# Patient Record
Sex: Male | Born: 1965
Health system: Southern US, Community
[De-identification: ages and names within clinical notes are randomized; demographics above are authoritative.]

## PROBLEM LIST (undated history)

## (undated) DIAGNOSIS — T7840XA Allergy, unspecified, initial encounter: Secondary | ICD-10-CM

## (undated) DIAGNOSIS — Q231 Congenital insufficiency of aortic valve: Secondary | ICD-10-CM

## (undated) DIAGNOSIS — Q2381 Bicuspid aortic valve: Secondary | ICD-10-CM

## (undated) DIAGNOSIS — I712 Thoracic aortic aneurysm, without rupture, unspecified: Secondary | ICD-10-CM

## (undated) DIAGNOSIS — I350 Nonrheumatic aortic (valve) stenosis: Secondary | ICD-10-CM

## (undated) HISTORY — DX: Allergy, unspecified, initial encounter: T78.40XA

## (undated) HISTORY — DX: Thoracic aortic aneurysm, without rupture: I71.2

## (undated) HISTORY — DX: Nonrheumatic aortic (valve) stenosis: I35.0

## (undated) HISTORY — PX: CERVICAL DISC SURGERY: SHX588

## (undated) HISTORY — DX: Bicuspid aortic valve: Q23.81

## (undated) HISTORY — DX: Thoracic aortic aneurysm, without rupture, unspecified: I71.20

## (undated) HISTORY — DX: Congenital insufficiency of aortic valve: Q23.1

## (undated) HISTORY — PX: SIGMOIDOSCOPY: SUR1295

---

## 2006-01-03 ENCOUNTER — Emergency Department (HOSPITAL_COMMUNITY): Admission: EM | Admit: 2006-01-03 | Discharge: 2006-01-03 | Payer: Self-pay | Admitting: Family Medicine

## 2009-03-06 ENCOUNTER — Ambulatory Visit (HOSPITAL_COMMUNITY): Admission: RE | Admit: 2009-03-06 | Discharge: 2009-03-07 | Payer: Self-pay | Admitting: Neurological Surgery

## 2010-11-23 ENCOUNTER — Ambulatory Visit (HOSPITAL_COMMUNITY)
Admission: RE | Admit: 2010-11-23 | Discharge: 2010-11-24 | Payer: Self-pay | Source: Home / Self Care | Admitting: Neurological Surgery

## 2010-12-28 ENCOUNTER — Ambulatory Visit (HOSPITAL_COMMUNITY): Admission: RE | Admit: 2010-12-28 | Payer: Self-pay | Source: Home / Self Care | Admitting: Neurological Surgery

## 2011-03-02 LAB — CBC
HCT: 40.7 % (ref 39.0–52.0)
Hemoglobin: 14.5 g/dL (ref 13.0–17.0)
RDW: 12.5 % (ref 11.5–15.5)

## 2011-04-01 LAB — BASIC METABOLIC PANEL
CO2: 24 mEq/L (ref 19–32)
Calcium: 9.4 mg/dL (ref 8.4–10.5)
Chloride: 104 mEq/L (ref 96–112)
GFR calc Af Amer: >60 mL/min (ref >60)
GFR calc non Af Amer: >60 mL/min (ref >60)
Sodium: 137 mEq/L (ref 135–145)

## 2011-04-01 LAB — CBC
MCV: 84.7 fL (ref 78.0–100.0)
RBC: 5.01 MIL/uL (ref 4.22–5.81)

## 2011-05-04 NOTE — Op Note (Signed)
NAME:  Adrian Murphy, Adrian Murphy NO.:  [id number]   MEDICAL RECORD NO.:  [id number]          PATIENT TYPE:  OIB   LOCATION:  3534                         FACILITY:  [facility]   PHYSICIAN:  Stefani Dama, M.D.  DATE OF BIRTH:  Feb 21, 1966   DATE OF PROCEDURE:  DATE OF DISCHARGE:                               OPERATIVE REPORT   PREOPERATIVE DIAGNOSIS:  Cervical herniated nucleus pulposus C5-6 with  myelopathy.   POSTOPERATIVE DIAGNOSIS:  Cervical herniated nucleus pulposus C5-6 with  myelopathy.   PROCEDURES:  Anterior cervical decompression C5-C6 arthrodesis with  structural allograft, Alphatec fixation C5-C6.   SURGEON:  Stefani Dama, MD   FIRST ASSISTANT:  Clydene Fake, MD   ANESTHESIA:  General endotracheal.   INDICATIONS:  Adrian Murphy is a 45 year old individual who has suffered  a large herniated nucleus pulposus C5-C6 with cord compression.  He had  initial symptoms of severe myelopathy but this resolved significantly,  and he has occasional difficulties with pain in his neck and symptoms  consistent with Lhermitte phenomenon.   PROCEDURE:  The patient was brought to the operating room, placed on the  table in a supine position.  After the smooth induction of general  endotracheal anesthesia, he was placed in 5 pounds of halter traction.  Neck was prepped with alcohol and DuraPrep and draped in a sterile  fashion.  A transverse incision was created in the left side of the  neck, and this was carried down through the platysma.  Plane between the  sternocleidomastoid and the strap muscles were dissected bluntly until  the prevertebral space was reached.  The most superior identified disk  space in this opening was localized at C4-5 on a radiograph.  Dissection  was easily carried inferiorly slightly to expose C5-C6.  The longus  colli muscle was stripped off either side of the midline, and the Caspar  retractor was then placed under the longus colli muscle.   The disk space  was opened with a #15 blade and a significant quantity of moderately  degenerated disk material was removed.  As the region of the posterior  longitudinal ligament was reached, there was noted to be a  subligamentously herniated disk material pressing dorsally into the cord  itself.  This was removed and then the ligament itself was taken up with  a 2-mm Kerrison punch which allowed for good decompression of the  central portion of the cord.  Dissection was carried out into the far  lateral recesses to decompress the C6 nerve roots on either side.  Hemostasis in the soft tissue was obtained meticulously, and the  endplates were shaved smooth with a high-speed bur and a 5-mm barrel  bit.  Once they were sized appropriately, an 8-mm transgraft was shaved  to the appropriate size and configuration to fit within this interval.  The graft itself was then filled with demineralized bone matrix and then  tamped into place and countersunk slightly.  The ventral aspects of the  vertebrae were then shaved appropriately to allow placement of a 14-mm  Alphatec Trestle plate.  This was  fitted to the ventral aspect of the  vertebral bodies with 4 variable angle locking 4- x 14-mm screws.  The  traction at this point was removed.  The screws were secured tightly and  then a final radiograph was obtained to identify good position of the  surgical construct.  The remainder of the demineralized bone matrix was  packed into the lateral gutters.  Hemostasis was checked in the cervical  wounds and under the longus colli muscle on either side.  Once this was  verified, the wound  was irrigated copiously with antibiotic irrigating solution, and the  platysma was closed with 3-0 Vicryl in interrupted fashion, 3-0 Vicryl  was used in the subcuticular tissues, and Dermabond was placed on the  skin.  The patient tolerated the procedure well, was returned to  recovery room in stable  condition.      Stefani Dama, M.D.  Electronically Signed     HJE/MEDQ  D:  03/06/2009  T:  03/07/2009  Job:  [id number]

## 2011-10-27 DIAGNOSIS — H5340 Unspecified visual field defects: Secondary | ICD-10-CM | POA: Insufficient documentation

## 2011-10-27 DIAGNOSIS — H47299 Other optic atrophy, unspecified eye: Secondary | ICD-10-CM | POA: Insufficient documentation

## 2015-08-01 ENCOUNTER — Other Ambulatory Visit: Payer: Self-pay | Admitting: Internal Medicine

## 2015-08-01 DIAGNOSIS — M5412 Radiculopathy, cervical region: Secondary | ICD-10-CM

## 2015-08-05 ENCOUNTER — Ambulatory Visit
Admission: RE | Admit: 2015-08-05 | Discharge: 2015-08-05 | Disposition: A | Discharge status: Home / Self Care | Payer: BLUE CROSS/BLUE SHIELD | Source: Ambulatory Visit | Attending: Internal Medicine | Admitting: Internal Medicine

## 2015-08-05 DIAGNOSIS — M5412 Radiculopathy, cervical region: Secondary | ICD-10-CM

## 2015-08-06 ENCOUNTER — Other Ambulatory Visit: Payer: Self-pay | Admitting: Internal Medicine

## 2015-08-06 DIAGNOSIS — M5412 Radiculopathy, cervical region: Secondary | ICD-10-CM

## 2016-05-20 DIAGNOSIS — R8290 Unspecified abnormal findings in urine: Secondary | ICD-10-CM | POA: Diagnosis not present

## 2016-05-20 DIAGNOSIS — Z0000 Encounter for general adult medical examination without abnormal findings: Secondary | ICD-10-CM | POA: Diagnosis not present

## 2016-05-20 DIAGNOSIS — Z125 Encounter for screening for malignant neoplasm of prostate: Secondary | ICD-10-CM | POA: Diagnosis not present

## 2016-05-26 DIAGNOSIS — Z1389 Encounter for screening for other disorder: Secondary | ICD-10-CM | POA: Diagnosis not present

## 2016-05-26 DIAGNOSIS — Z125 Encounter for screening for malignant neoplasm of prostate: Secondary | ICD-10-CM | POA: Diagnosis not present

## 2016-05-26 DIAGNOSIS — Z8249 Family history of ischemic heart disease and other diseases of the circulatory system: Secondary | ICD-10-CM | POA: Diagnosis not present

## 2016-05-26 DIAGNOSIS — Z0000 Encounter for general adult medical examination without abnormal findings: Secondary | ICD-10-CM | POA: Diagnosis not present

## 2016-05-26 DIAGNOSIS — N529 Male erectile dysfunction, unspecified: Secondary | ICD-10-CM | POA: Diagnosis not present

## 2016-05-26 DIAGNOSIS — M5412 Radiculopathy, cervical region: Secondary | ICD-10-CM | POA: Diagnosis not present

## 2016-05-26 DIAGNOSIS — L989 Disorder of the skin and subcutaneous tissue, unspecified: Secondary | ICD-10-CM | POA: Diagnosis not present

## 2016-05-27 DIAGNOSIS — Z1212 Encounter for screening for malignant neoplasm of rectum: Secondary | ICD-10-CM | POA: Diagnosis not present

## 2016-06-15 DIAGNOSIS — C44319 Basal cell carcinoma of skin of other parts of face: Secondary | ICD-10-CM | POA: Diagnosis not present

## 2016-08-10 DIAGNOSIS — Z85828 Personal history of other malignant neoplasm of skin: Secondary | ICD-10-CM | POA: Diagnosis not present

## 2016-11-09 DIAGNOSIS — Z85828 Personal history of other malignant neoplasm of skin: Secondary | ICD-10-CM | POA: Diagnosis not present

## 2016-11-09 DIAGNOSIS — L570 Actinic keratosis: Secondary | ICD-10-CM | POA: Diagnosis not present

## 2016-11-09 DIAGNOSIS — L821 Other seborrheic keratosis: Secondary | ICD-10-CM | POA: Diagnosis not present

## 2016-11-09 DIAGNOSIS — D1801 Hemangioma of skin and subcutaneous tissue: Secondary | ICD-10-CM | POA: Diagnosis not present

## 2016-12-17 DIAGNOSIS — Z6829 Body mass index (BMI) 29.0-29.9, adult: Secondary | ICD-10-CM | POA: Diagnosis not present

## 2016-12-17 DIAGNOSIS — M7712 Lateral epicondylitis, left elbow: Secondary | ICD-10-CM | POA: Diagnosis not present

## 2017-03-04 DIAGNOSIS — L308 Other specified dermatitis: Secondary | ICD-10-CM | POA: Diagnosis not present

## 2017-05-13 DIAGNOSIS — L570 Actinic keratosis: Secondary | ICD-10-CM | POA: Diagnosis not present

## 2017-05-13 DIAGNOSIS — Z85828 Personal history of other malignant neoplasm of skin: Secondary | ICD-10-CM | POA: Diagnosis not present

## 2017-05-13 DIAGNOSIS — L309 Dermatitis, unspecified: Secondary | ICD-10-CM | POA: Diagnosis not present

## 2017-06-01 DIAGNOSIS — Z0000 Encounter for general adult medical examination without abnormal findings: Secondary | ICD-10-CM | POA: Diagnosis not present

## 2017-06-01 DIAGNOSIS — Z125 Encounter for screening for malignant neoplasm of prostate: Secondary | ICD-10-CM | POA: Diagnosis not present

## 2017-06-08 DIAGNOSIS — Z0000 Encounter for general adult medical examination without abnormal findings: Secondary | ICD-10-CM | POA: Diagnosis not present

## 2017-06-08 DIAGNOSIS — Z85828 Personal history of other malignant neoplasm of skin: Secondary | ICD-10-CM | POA: Diagnosis not present

## 2017-06-08 DIAGNOSIS — Z8249 Family history of ischemic heart disease and other diseases of the circulatory system: Secondary | ICD-10-CM | POA: Diagnosis not present

## 2017-06-08 DIAGNOSIS — N528 Other male erectile dysfunction: Secondary | ICD-10-CM | POA: Diagnosis not present

## 2017-06-08 DIAGNOSIS — Z1389 Encounter for screening for other disorder: Secondary | ICD-10-CM | POA: Diagnosis not present

## 2017-06-08 DIAGNOSIS — R7301 Impaired fasting glucose: Secondary | ICD-10-CM | POA: Diagnosis not present

## 2017-06-08 DIAGNOSIS — Z125 Encounter for screening for malignant neoplasm of prostate: Secondary | ICD-10-CM | POA: Diagnosis not present

## 2017-06-09 ENCOUNTER — Other Ambulatory Visit: Payer: Self-pay | Admitting: Internal Medicine

## 2017-06-09 DIAGNOSIS — Z8249 Family history of ischemic heart disease and other diseases of the circulatory system: Secondary | ICD-10-CM

## 2017-06-13 ENCOUNTER — Ambulatory Visit
Admission: RE | Admit: 2017-06-13 | Discharge: 2017-06-13 | Disposition: A | Discharge status: Home / Self Care | Payer: No Typology Code available for payment source | Source: Ambulatory Visit | Attending: Internal Medicine | Admitting: Internal Medicine

## 2017-06-13 DIAGNOSIS — Z8249 Family history of ischemic heart disease and other diseases of the circulatory system: Secondary | ICD-10-CM

## 2017-07-06 ENCOUNTER — Telehealth: Payer: Self-pay | Admitting: Cardiology

## 2017-07-06 NOTE — Telephone Encounter (Signed)
[facility] 07[facility]21[facility]17 with Dr Jens Somrenshaw.  Records put with Dr Ludwig Clarksrenshaw's schedule for 07/21/17. lp

## 2017-07-19 NOTE — Progress Notes (Signed)
    Su Hilteferring-William Shaw, MD Reason for referral-Thoracic aortic aneurysm  HPI: 51 yo male for evaluation of TAA at request of Martha ClanWilliam Shaw, MD. Laboratories from June 2018 personally reviewed and showed total cholesterol 161 and LDL 93. Creatinine 0.9 and potassium 4.7. Normal liver functions. Patient had a cardiac CT in June 2018. Coronary calcium score was 0. He was noted to have a thoracic aortic aneurysm measuring 4.5 cm. There was note of small pulmonary nodules and follow-up noncontrast chest CT could be considered in 12 months if patient high risk. Patient denies dyspnea on exertion, orthopnea, PND, pedal edema, exertional chest pain or syncope. Because of the above we were asked to evaluate.   Current Outpatient Prescriptions  Medication Sig Dispense Refill  . Cetirizine HCl (ZYRTEC ALLERGY) 10 MG CAPS Take 1 tablet by mouth daily.    [name]. CIALIS 5 MG tablet Take 5 mg by mouth daily.  2   No current facility-administered medications for this visit.     No Known Allergies   Past Medical History:  Diagnosis Date  . Thoracic aortic aneurysm [facility])     Past Surgical History:  Procedure Laterality Date  . CERVICAL DISC SURGERY      Social History   Social History  . Marital status: Married    Spouse name: N/A  . Number of children: 4  . Years of education: N/A   Occupational History  . Not on file.   Social History Main Topics  . Smoking status: Never Smoker  . Smokeless tobacco: Never Used  . Alcohol use No  . Drug use: Unknown  . Sexual activity: Not on file   Other Topics Concern  . Not on file   Social History Narrative  . No narrative on file    Family History  Problem Relation Age of Onset  . Alzheimer's disease Mother   . CAD Father     ROS: no fevers or chills, productive cough, hemoptysis, dysphasia, odynophagia, melena, hematochezia, dysuria, hematuria, rash, seizure activity, orthopnea, PND, pedal edema, claudication. Remaining systems are  negative.  Physical Exam:   Blood pressure 90/60, pulse 68, height 5\' 9"  (1.753 m), weight 94.6 kg (208 lb 9.6 oz).  General:  Well developed/well nourished in NAD Skin warm/dry Patient not depressed No peripheral clubbing Back-normal HEENT-normal/normal eyelids Neck supple/normal carotid upstroke bilaterally; no bruits; no JVD; no thyromegaly chest - CTA/ normal expansion CV - RRR/normal S1 and S2; no murmurs, rubs or gallops;  PMI nondisplaced Abdomen -NT/ND, no HSM, no mass, + bowel sounds, soft bruit 2+ femoral pulses, no bruits Ext-no edema, chords, 2+ DP Neuro-grossly nonfocal  ECG - normal sinus rhythm at a rate of 68. No ST changes. personally reviewed  A/P  1 thoracic aortic aneurysm-plan follow-up CTA December 2018; I will include the abdominal aorta as there is a soft bruit. Plan echocardiogram to make sure patient does not have bicuspid aortic valve. No family history of dissection. Patient does not have Marfan's. Will not add ARB or beta blocker as systolic blood pressure is 90 and no history of hypertension.   2 Family history of coronary artery disease-calcium score is 0 and patient is asymptomatic. Electrocardiogram is normal. Continue lifestyle modification.  Olga MillersBrian Adden Strout, MD

## 2017-07-21 ENCOUNTER — Ambulatory Visit (INDEPENDENT_AMBULATORY_CARE_PROVIDER_SITE_OTHER): Payer: BLUE CROSS/BLUE SHIELD | Admitting: Cardiology

## 2017-07-21 ENCOUNTER — Encounter: Payer: Self-pay | Admitting: Cardiology

## 2017-07-21 VITALS — BP 90/60 | HR 68 | Ht 69.0 in | Wt 208.6 lb

## 2017-07-21 DIAGNOSIS — I712 Thoracic aortic aneurysm, without rupture, unspecified: Secondary | ICD-10-CM

## 2017-07-21 NOTE — Patient Instructions (Signed)
Medication Instructions:   NO CHANGE  Testing/Procedures:  Your physician has requested that you have an echocardiogram. Echocardiography is a painless test that uses sound waves to create images of your heart. It provides your doctor with information about the size and shape of your heart and how well your heart's chambers and valves are working. This procedure takes approximately one hour. There are no restrictions for this procedure.    Follow-Up:  Your physician wants you to follow-up in: ONE YEAR WITH DR CRENSHAW You will receive a reminder letter in the mail two months in advance. If you don't receive a letter, please call our office to schedule the follow-up appointment.      

## 2017-07-26 ENCOUNTER — Ambulatory Visit (HOSPITAL_COMMUNITY): Payer: BLUE CROSS/BLUE SHIELD | Attending: Cardiology

## 2017-07-26 ENCOUNTER — Other Ambulatory Visit: Payer: Self-pay

## 2017-07-26 DIAGNOSIS — I712 Thoracic aortic aneurysm, without rupture, unspecified: Secondary | ICD-10-CM

## 2017-09-21 ENCOUNTER — Encounter: Payer: BLUE CROSS/BLUE SHIELD | Admitting: Cardiothoracic Surgery

## 2017-09-30 ENCOUNTER — Institutional Professional Consult (permissible substitution) (INDEPENDENT_AMBULATORY_CARE_PROVIDER_SITE_OTHER): Payer: BLUE CROSS/BLUE SHIELD | Admitting: Cardiothoracic Surgery

## 2017-09-30 ENCOUNTER — Encounter: Payer: Self-pay | Admitting: Cardiothoracic Surgery

## 2017-09-30 VITALS — BP 104/71 | HR 73 | Resp 16 | Ht 69.0 in | Wt 200.0 lb

## 2017-09-30 DIAGNOSIS — I712 Thoracic aortic aneurysm, without rupture, unspecified: Secondary | ICD-10-CM

## 2017-09-30 NOTE — Progress Notes (Signed)
[address]       [place] [address]             [phone]                    Adrian Murphy  Medical Record #[id number] Date of Birth: April 01, 1966  Referring: Martha Clan, MD Primary Care: Martha Clan, MD Cardiology: Dr Jens Som  Chief Complaint:    Chief Complaint  Patient presents with  . Thoracic Aortic Aneurysm    eval..Marland KitchenCT CARDIAC SCORING 06/03/17, ECHO     History of Present Illness:    Adrian Murphy 51 y.o. male is seen in the office  today for evaluation of dilated ascending aorta.  Patient had a limted cardiac CT in June 2018. Coronary calcium score was 0. He was noted to have a thoracic aortic aneurysm measuring 4.5 cm. Echocardiogram "suggests bicuspid " valve.    Patient does not have clear family history of ascending aortic aneurysm, bicuspid valve or aortic dissection. However his father died at age 51 two hours after arrival to hospital- assumed to be MI but no autopsy. parenteral grandfather  Died in his  79's  Sudden collapse but no definite dx . He has two sisters in there 60's who have been evaluated for dilated aorta .     Current Activity/ Functional Status:  Patient is independent with mobility/ambulation, transfers, ADL's, IADL's.   Zubrod Score: At the time of surgery this patient's most appropriate activity status/level should be described as: [x]     0    Normal activity, no symptoms []     1    Restricted in physical strenuous activity but ambulatory, able to do out light work []     2    Ambulatory and capable of self care, unable to do work activities, up and about               >50 % of waking hours                              []     3    Only limited self care, in bed greater than 50% of waking hours []     4    Completely disabled, no self care, confined to bed or chair []     5    Moribund   Past Medical History:  Diagnosis Date  . Thoracic aortic aneurysm [facility])     Past Surgical History:  Procedure  Laterality Date  . CERVICAL DISC SURGERY      Family History  Problem Relation Age of Onset  . Alzheimer's disease Mother   . CAD Father     See above     Social History   Social History  . Marital status: Married    Spouse name: N/A  . Number of children: 4  . Years of education: N/A   Occupational History  . 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 Smoking status: Never Smoker  . Smokeless tobacco: Never Used  . Alcohol use No  . Drug use: Unknown  . Sexual activity: Not on file     History  Smoking Status  . Never Smoker  Smokeless Tobacco  . Never Used    History  Alcohol Use No     Allergies  Allergen Reactions  . Pollen Extract Other (See Comments)  Runny nose, Itchy Eyes    Current Outpatient Prescriptions  Medication Sig Dispense Refill  . Cetirizine HCl (ZYRTEC ALLERGY) 10 MG CAPS Take 1 tablet by mouth daily.    [name] CIALIS 5 MG tablet Take 5 mg by mouth daily.  2   No current facility-administered medications for this visit.     Pertinent items are noted in HPI.   Review of Systems:     Cardiac Review of Systems: Y or N  Chest Pain [ n   ]  Resting SOB [  n ] Exertional SOB  [ n ]  Orthopnea [  ]   Pedal Edema [ n  ]    Palpitations [n  ] Syncope  [n  ]   Presyncope [   ]  General Review of Systems: [Y] = yes [  ]=no Constitional: recent weight change [  ];  Wt loss over the last 3 months [   ] anorexia [  ]; fatigue [  ]; nausea [  ]; night sweats [  ]; fever [  ]; or chills [  ];          Dental: poor dentition[ n ]; Last Dentist visit:   Eye : blurred vision [  ]; diplopia [   ]; vision changes [  ];  Amaurosis fugax[  ]; Resp: cough [ n ];  wheezing[n  ];  hemoptysis[n  ]; shortness of breath[  ]; paroxysmal nocturnal dyspnea[  ]; dyspnea on exertion[  ]; or orthopnea[  ];  GI:  gallstones[  ], vomiting[  ];  dysphagia[  ]; melena[  ];  hematochezia [  ]; heartburn[  ];   Hx of  Colonoscopy[  ]; GU: kidney  stones [  ]; hematuria[  ];   dysuria [  ];  nocturia[  ];  history of     obstruction [  ]; urinary frequency [  ]             Skin: rash, swelling[  ];, hair loss[  ];  peripheral edema[  ];  or itching[  ]; Musculosketetal: myalgias[  ];  joint swelling[  ];  joint erythema[  ];  joint pain[  ];  back pain[  ];  Heme/Lymph: bruising[  ];  bleeding[  ];  anemia[  ];  Neuro: TIA[ n ];  headaches[  ];  stroke[  n];  vertigo[  ];  seizures[  ];   paresthesias[  ];  difficulty walking[  ];  Psych:depression[  ]; anxiety[  ];  Endocrine: diabetes[ n ];  thyroid dysfunction[  n];  Immunizations: Flu up to date [ y ]; Pneumococcal up to date [ n ];  Other:  Physical Exam: BP 104/71 (BP Location: Left Arm, Patient Position: Sitting, Cuff Size: Large)   Pulse 73   Resp 16   Ht 5\' 9"  (1.753 m)   Wt 200 lb (90.7 kg)   SpO2 99% Comment: ON RA  BMI 29.53 kg/m   PHYSICAL EXAMINATION: General appearance: alert, cooperative and appears stated age Head: Normocephalic, without obvious abnormality, atraumatic Neck: no adenopathy, no carotid bruit, no JVD, supple, symmetrical, trachea midline and thyroid not enlarged, symmetric, no tenderness/mass/nodules Lymph nodes: Cervical, supraclavicular, and axillary nodes normal. Resp: clear to auscultation bilaterally Back: symmetric, no curvature. ROM normal. No CVA tenderness. Cardio: regular rate and rhythm, S1, S2 normal, no murmur, click, rub or gallop GI: soft, non-tender; bowel sounds normal; no masses,  no organomegaly Extremities: extremities normal,  atraumatic, no cyanosis or edema and Homans sign is negative, no sign of DVT Neurologic: Grossly normal  Diagnostic Studies & Laboratory data:   ECHO:07/26/2017: Echocardiography  Patient:    [name], [name] MR #:       [id number] Study Date: 07/26/2017 Gender:     M Age:        51 Height:     175.3 cm Weight:     94.6 kg BSA:        2.17 m^2 Pt. Status: Room:   ATTENDING    Olga Millers  ORDERING     Arlys John Crenshaw  REFERRING    Olga Millers  SONOGRAPHER  Dewitt Hoes, RDCS  PERFORMING   [facility], Outpatient  cc:  ------------------------------------------------------------------- LV EF: 55% -   60%  ------------------------------------------------------------------- Indications:      Thoracic aortic aneurysm (I71.2).  ------------------------------------------------------------------- History:   Risk factors:  Thoracic aortic aneurysm.  ------------------------------------------------------------------- Study Conclusions  - Left ventricle: The cavity size was normal. Systolic function was   normal. The estimated ejection fraction was in the range of 55%   to 60%. Wall motion was normal; there were no regional wall   motion abnormalities. Left ventricular diastolic function   parameters were normal. - Aortic valve: Moderately calcified annulus. Probable bicuspid;   mildly thickened, mildly calcified leaflets. There was trivial   regurgitation. Mean gradient (S): 11 mm Hg. Valve area (VTI):   2.27 cm^2. - Aorta: Ascending aortic diameter: 44 mm (S). - Ascending aorta: The ascending aorta was mildly dilated. - Pulmonary arteries: Systolic pressure could not be accurately   estimated.  Impressions:  - Normal LVF with EF 55-60%, poorly visualized AV but likely   bicuspid with moderate aortic annular calcification and mildly   thickened AV leaflets with no AS. There is trivial AI. Mild to   moderately dilated ascending aorta at 4.4cm.  ------------------------------------------------------------------- Study data:  No prior study was available for comparison.  Study status:  Routine.  Procedure:  The patient reported no pain pre or post test. Transthoracic echocardiography. Image quality was adequate.  Study completion:  There were no complications. Echocardiography.  M-mode, complete 2D, 3D, spectral Doppler, and color Doppler.  Birthdate:   Patient birthdate: 1966/12/18.  Age: Patient is 51 yr old.  Sex:  Gender: male.    BMI: 30.8 kg/m^2. Blood pressure:     90/60  Patient status:  Outpatient.  Study date:  Study date: 07/26/2017. Study time: 03:55 PM.  Location: [facility]  -------------------------------------------------------------------  ------------------------------------------------------------------- Left ventricle:  The cavity size was normal. Systolic function was normal. The estimated ejection fraction was in the range of 55% to 60%. Wall motion was normal; there were no regional wall motion abnormalities. The transmitral flow pattern was normal. The deceleration time of the early transmitral flow velocity was normal. The pulmonary vein flow pattern was normal. The tissue Doppler parameters were normal. Left ventricular diastolic function parameters were normal.  ------------------------------------------------------------------- Aortic valve:  Poorly visualized. Moderately calcified annulus. Probable bicuspid; mildly thickened, mildly calcified leaflets. Mobility was not restricted.  Doppler:  Transvalvular velocity was within the normal range. There was no stenosis. There was trivial regurgitation.    VTI ratio of LVOT to aortic valve: 0.4. Valve area (VTI): 2.27 cm^2. Indexed valve area (VTI): 1.04 cm^2/m^2. Peak velocity ratio of LVOT to aortic valve: 0.37. Valve area (Vmax): 2.13 cm^2. Indexed valve area (Vmax): 0.98 cm^2/m^2. Mean velocity ratio of LVOT to aortic valve: 0.41. Valve area (Vmean):  2.33 cm^2. Indexed valve area (Vmean): 1.07 cm^2/m^2.    Mean gradient (S): 11 mm Hg. Peak gradient (S): 19 mm Hg.  ------------------------------------------------------------------- Aorta:  Aortic root: The aortic root was normal in size. Ascending aorta: The ascending aorta was mildly dilated.  ------------------------------------------------------------------- Mitral valve:   Structurally  normal valve.   Mobility was not restricted.  Doppler:  Transvalvular velocity was within the normal range. There was no evidence for stenosis. There was no regurgitation.    Peak gradient (D): 4 mm Hg.  ------------------------------------------------------------------- Left atrium:  The atrium was normal in size.  ------------------------------------------------------------------- Right ventricle:  The cavity size was normal. Wall thickness was normal. Systolic function was normal.  ------------------------------------------------------------------- Pulmonic valve:    Structurally normal valve.   Cusp separation was normal.  Doppler:  Transvalvular velocity was within the normal range. There was no evidence for stenosis. There was no regurgitation.  ------------------------------------------------------------------- Tricuspid valve:   Structurally normal valve.    Doppler: Transvalvular velocity was within the normal range. There was no regurgitation.  ------------------------------------------------------------------- Pulmonary artery:   The main pulmonary artery was normal-sized. Systolic pressure could not be accurately estimated.  ------------------------------------------------------------------- Right atrium:  The atrium was normal in size.  ------------------------------------------------------------------- Pericardium:  There was no pericardial effusion.  ------------------------------------------------------------------- Systemic veins: Inferior vena cava: The vessel was normal in size.  ------------------------------------------------------------------- Measurements   Left ventricle                            Value          Reference  LV ID, ED, PLAX chordal                   45.4  mm       43 - 52  LV ID, ES, PLAX chordal                   36.9  mm       23 - 38  LV fx shortening, PLAX chordal    (L)     19    %        >=29  LV PW thickness, ED                        8.62  mm       ---------  IVS/LV PW ratio, ED                       0.92           <=1.3  Stroke volume, 2D                         99    ml       ---------  Stroke volume/bsa, 2D                     46    ml/m^2   ---------  LV e&', lateral                            12.7  cm/s     ---------  LV E/e&', lateral                          7.87           ---------  LV e&', medial                             9.57  cm/s     ---------  LV E/e&', medial                           10.44          ---------  LV e&', average                            11.14 cm/s     ---------  LV E/e&', average                          8.97           ---------    Ventricular septum                        Value          Reference  IVS thickness, ED                         7.94  mm       ---------    LVOT                                      Value          Reference  LVOT ID, S                                27    mm       ---------  LVOT area                                 5.73  cm^2     ---------  LVOT peak velocity, S                     81.6  cm/s     ---------  LVOT mean velocity, S                     61.4  cm/s     ---------  LVOT VTI, S                               17.3  cm       ---------    Aortic valve                              Value          Reference  Aortic valve peak velocity, S             220   cm/s     ---------  Aortic valve mean velocity, S             151   cm/s     ---------  Aortic valve VTI, S  43.7  cm       ---------  Aortic mean gradient, S                   11    mm Hg    ---------  Aortic peak gradient, S                   19    mm Hg    ---------  VTI ratio, LVOT/AV                        0.4            ---------  Aortic valve area, VTI                    2.27  cm^2     ---------  Aortic valve area/bsa, VTI                1.04  cm^2/m^2 ---------  Velocity ratio, peak, LVOT/AV             0.37           ---------  Aortic valve area, peak velocity           2.13  cm^2     ---------  Aortic valve area/bsa, peak               0.98  cm^2/m^2 ---------  velocity  Velocity ratio, mean, LVOT/AV             0.41           ---------  Aortic valve area, mean velocity          2.33  cm^2     ---------  Aortic valve area/bsa, mean               1.07  cm^2/m^2 ---------  velocity    Aorta                                     Value          Reference  Aortic root ID, ED                        36    mm       ---------  Ascending aorta ID, A-P, S                44    mm       ---------    Left atrium                               Value          Reference  LA ID, A-P, ES                            30    mm       ---------  LA ID/bsa, A-P                            1.38  cm/m^2   <=2.2  LA volume, S  45.7  ml       ---------  LA volume/bsa, S                          21    ml/m^2   ---------  LA volume, ES, 1-p A4C                    38.8  ml       ---------  LA volume/bsa, ES, 1-p A4C                17.9  ml/m^2   ---------  LA volume, ES, 1-p A2C                    45.5  ml       ---------  LA volume/bsa, ES, 1-p A2C                20.9  ml/m^2   ---------    Mitral valve                              Value          Reference  Mitral E-wave peak velocity               99.9  cm/s     ---------  Mitral A-wave peak velocity               65.5  cm/s     ---------  Mitral deceleration time          (L)     148   ms       150 - 230  Mitral peak gradient, D                   4     mm Hg    ---------  Mitral E/A ratio, peak                    1.5            ---------    Systemic veins                            Value          Reference  Estimated CVP                             3     mm Hg    ---------  Legend: (L)  and  (H)  mark values outside specified reference range.  ------------------------------------------------------------------- Prepared and Electronically Authenticated by  Armanda Magic,  MD 2018-08-07T18:15:47   Recent Radiology Findings:   CLINICAL DATA:  White male with family history of heart disease.  EXAM: CT HEART FOR CALCIUM SCORING  TECHNIQUE: CT heart was performed on a 64 channel system using prospective ECG gating.  A non-contrast exam for calcium scoring was performed.  Note that this exam targets the heart and the chest was not imaged in its entirety.  COMPARISON:  None.  FINDINGS: Technical quality: Good.  CORONARY CALCIUM  Total Agatston Score: 0  MESA database percentile:  0  OTHER FINDINGS:  Cardiovascular: Aneurysm of the ascending thoracic aorta measuring up to 4.5 cm. Calcifications at the aortic root and aortic valve. No coronary artery  calcifications. Normal caliber of the main pulmonary artery. Trace amount of pericardial fluid.  Mediastinum/Nodes: No lymph node enlargement in the visualized mediastinum. Visualized esophagus is unremarkable.  Lungs/Pleura: 5 mm nodule along the right minor fissure on sequence 4, image 10. No large pleural effusions. 4 mm pleural-based nodule in the left upper lobe on sequence 4, image 1.  Upper Abdomen: Negative  Musculoskeletal: No acute bone abnormality.  IMPRESSION: Coronary calcium score is 0.  Aneurysm of the ascending thoracic aorta measuring up to 4.5 cm. Ascending thoracic aortic aneurysm. Recommend semi-annual imaging followup by CTA or MRA and referral to cardiothoracic surgery if not already obtained. This recommendation follows 2010 ACCF/AHA/AATS/ACR/ASA/SCA/SCAI/SIR/STS/SVM Guidelines for the Diagnosis and Management of Patients With Thoracic Aortic Disease. Circulation. 2010; 121: K440-N027  Small indeterminate pulmonary nodules. No follow-up needed if patient is low-risk (and has no known or suspected primary neoplasm). Non-contrast chest CT can be considered in 12 months if patient is high-risk. This recommendation follows the consensus statement:  Guidelines for Management of Incidental Pulmonary Nodules Detected on CT Images: From the Fleischner Society 2017; Radiology 2017; 284:228-243.   Electronically Signed   By: Richarda Overlie M.D.   On: 06/13/2017 14:09 I have independently reviewed the above radiologic studies.  Recent Lab Findings: Lab Results  Component Value Date   WBC 8.1 11/19/2010   HGB 14.5 11/19/2010   HCT 40.7 11/19/2010   PLT 302 11/19/2010   GLUCOSE 92 03/06/2009   NA 137 03/06/2009   K 3.8 03/06/2009   CL 104 03/06/2009   CREATININE 0.92 03/06/2009   BUN 13 03/06/2009   CO2 24 03/06/2009   Aortic Size Index=   4.5      /Body surface area is 2.1 meters squared. = 2.14 < 2.75 cm/m2      4% risk per year 2.75 to 4.25          8% risk per year > 4.25 cm/m2    20% risk per year     Assessment / Plan:   Probable bicuspid Aortic Valve  4.5 cm. Ascending thoracic aortic aneurysm Possible Family History of sullen death, ? Aortic dissection   Reviewed with patient dx of bicuspid aortic valve and dilated  Plan to get full cardiac gated cta of chest in the next 2 weeks and review. Previous scan was without contrast and was limited to heart only.   Patient was warned about not using Cipro and similar antibiotics. Recent studies have raised concern that fluoroquinolone antibiotics could be associated with an increased risk of aortic aneurysm Fluoroquinolones have non-antimicrobial properties that might jeopardise the integrity of the extracellular matrix of the vascular wall In a  propensity score matched cohort study in [place], there was a 66% increased rate of aortic aneurysm or dissection associated with oral fluoroquinolone use, compared with amoxicillin use, within a 60 day risk period from start of treatment  The [employer] of [employer]iology [facility]) and the [place] Heart Association (AHA) have issued a statement to clarify 2 previous guidelines from the [facility], AHA, and collaborating societies addressing  the risk of aortic dissection in patients with bicuspid aortic valves (BAV) and severe aortic enlargement. The 2 guidelines differ with regard to the recommended threshold of aortic root or ascending aortic dilatation that would justify surgical intervention in patients with bicuspid aortic valves. This new statement of clarification uses the ACC/AHA revised structure for delineating the Class of Recommendation and Level of Evidence to provide recommendation that replace those contained in Section 9.2.2.1  of the thoracic aortic disease guidelines and Section 5.1.3 of the valvular heart disease guideline. New recommendations in intervention in patients with BAV and dilatation of the aortic root (sinuses) or ascending aorta include:  . Operative intervention to repair or replace the aortic root (sinuses) or replace the ascending aorta is indicated in asymptomatic patients with BAV if the diameter of the aortic root or ascending aorta is 5.5 cm or greater. (Class of recommendation 1, Level of evidence B-NR).  [name] Operative intervention to repair or replace the aortic root (sinuses) or replace the ascending aorta is reasonable in asymptomatic patients with BAV if the diameter of the aortic root or ascending aorta is 5.0 cm or greater and an additional risk factor for dissection is present or if the patient is at low surgical risk and the surgery is performed by an experienced aortic surgical team in a center with established expertise in these procedures. (Class of recommendation IIa; Level of Evidence B-NR).  . Replacement of the ascending aorta is reasonable in patients with BAV undergoing AVR because of severe aortic stenosis or aortic regurgitation when the diameter of the ascending aorta is greater than 4.5 cm (Class of recommendation IIa; Level of evidence C-EO).  Citation: Gae Bon, Kristen Loader, et al. Surgery for aortic dilatation in patients with bicuspid aortic valves. A statement of  clarification from the [employer]iology/American Heart Association Task Force on Clinical Practice Guidelines. [Published online ahead of print November 22, 2014]. Circulation. doi: 10.1161/CIR.0000000000000331.   I  spent 40 minutes counseling the patient face to face and 50% or more the  time was spent in counseling and coordination of care. The total time spent in the appointment was 60 minutes.  Delight Ovens MD      [address] [place] [place] [address] Office [phone]   Beeper [phone]  09/30/2017 11:48 AM

## 2017-09-30 NOTE — Patient Instructions (Signed)

## 2017-10-03 ENCOUNTER — Other Ambulatory Visit: Payer: Self-pay | Admitting: *Deleted

## 2017-10-03 DIAGNOSIS — I712 Thoracic aortic aneurysm, without rupture, unspecified: Secondary | ICD-10-CM

## 2017-10-03 DIAGNOSIS — Q231 Congenital insufficiency of aortic valve: Secondary | ICD-10-CM

## 2017-10-03 DIAGNOSIS — Q2381 Bicuspid aortic valve: Secondary | ICD-10-CM

## 2017-10-03 DIAGNOSIS — Z8249 Family history of ischemic heart disease and other diseases of the circulatory system: Secondary | ICD-10-CM

## 2017-10-12 ENCOUNTER — Ambulatory Visit (HOSPITAL_COMMUNITY)
Admission: RE | Admit: 2017-10-12 | Discharge: 2017-10-12 | Disposition: A | Discharge status: Home / Self Care | Payer: BLUE CROSS/BLUE SHIELD | Source: Ambulatory Visit | Attending: Cardiothoracic Surgery | Admitting: Cardiothoracic Surgery

## 2017-10-12 DIAGNOSIS — I712 Thoracic aortic aneurysm, without rupture, unspecified: Secondary | ICD-10-CM

## 2017-10-12 DIAGNOSIS — Q231 Congenital insufficiency of aortic valve: Secondary | ICD-10-CM

## 2017-10-12 DIAGNOSIS — Z8249 Family history of ischemic heart disease and other diseases of the circulatory system: Secondary | ICD-10-CM | POA: Diagnosis not present

## 2017-10-12 DIAGNOSIS — I719 Aortic aneurysm of unspecified site, without rupture: Secondary | ICD-10-CM | POA: Diagnosis not present

## 2017-10-12 MED ORDER — NITROGLYCERIN 0.4 MG SL SUBL
SUBLINGUAL_TABLET | SUBLINGUAL | Status: AC
  Filled 2017-10-12: qty 2

## 2017-10-12 MED ORDER — IOPAMIDOL (ISOVUE-370) INJECTION 76%
INTRAVENOUS | Status: AC
  Administered 2017-10-12: 80 mL via INTRAVENOUS
  Filled 2017-10-12: qty 100

## 2017-10-12 MED ORDER — NITROGLYCERIN 0.4 MG SL SUBL
0.8000 mg | SUBLINGUAL_TABLET | Freq: Once | SUBLINGUAL | Status: AC
  Administered 2017-10-12: 0.8 mg via SUBLINGUAL
  Filled 2017-10-12: qty 25

## 2017-10-12 NOTE — Progress Notes (Signed)
CT scan completed. Tolerated well. D/C home walking, awake and alert. In no distress. 

## 2017-10-20 ENCOUNTER — Ambulatory Visit (INDEPENDENT_AMBULATORY_CARE_PROVIDER_SITE_OTHER): Payer: BLUE CROSS/BLUE SHIELD | Admitting: Cardiothoracic Surgery

## 2017-10-20 ENCOUNTER — Encounter: Payer: Self-pay | Admitting: Cardiothoracic Surgery

## 2017-10-20 VITALS — BP 117/84 | HR 85 | Ht 69.0 in | Wt 210.0 lb

## 2017-10-20 DIAGNOSIS — I712 Thoracic aortic aneurysm, without rupture, unspecified: Secondary | ICD-10-CM

## 2017-10-20 DIAGNOSIS — Q231 Congenital insufficiency of aortic valve: Secondary | ICD-10-CM

## 2017-10-20 NOTE — Progress Notes (Signed)
[address]       [place] [address]             [phone]                    Adrian Murphy  Medical Record #[id number] Date of Birth: 1966-10-25  Referring: Adrian Clan, MD Primary Care: Adrian Clan, MD Cardiology: Dr Adrian Murphy  Chief Complaint:    Chief Complaint  Patient presents with  . Follow-up    cardiac gated CT    History of Present Illness:    Adrian Murphy 51 y.o. male is seen in the office  today for evaluation of dilated ascending aorta.  Patient was seen last month after referral based on a limited cardiac CT in June 2018  Coronary calcium score was 0. He was noted to have a thoracic aortic aneurysm measuring 4.5 cm. Echocardiogram "suggests bicuspid " valve.  The patient returns today with a full CTA of the chest gated for full evaluation of the intrathoracic aorta.   Patient does not have clear family history of ascending aortic aneurysm, bicuspid valve or aortic dissection. However his father died at age 21 two hours after arrival to hospital- assumed to be MI but no autopsy. parenteral grandfather  Died in his  38's  Sudden collapse but no definite dx . He has two sisters in there 60's who have been evaluated for dilated aorta .     Current Activity/ Functional Status:  Patient is independent with mobility/ambulation, transfers, ADL's, IADL's.   Zubrod Score: At the time of surgery this patient's most appropriate activity status/level should be described as: [x]     0    Normal activity, no symptoms []     1    Restricted in physical strenuous activity but ambulatory, able to do out light work []     2    Ambulatory and capable of self care, unable to do work activities, up and about               >50 % of waking hours                              []     3    Only limited self care, in bed greater than 50% of waking hours []     4    Completely disabled, no self care, confined to bed or chair []     5    Moribund   Past  Medical History:  Diagnosis Date  . Thoracic aortic aneurysm [facility])     Past Surgical History:  Procedure Laterality Date  . CERVICAL DISC SURGERY      Family History  Problem Relation Age of Onset  . Alzheimer's disease Mother   . CAD Father   . Stroke Paternal Grandmother   . Heart disease Paternal Grandfather     See above     Social History   Social History  . Marital status: Married    Spouse name: N/A  . Number of children: 4  . Years of education: N/A   Occupational History  . Adrian Murphy at [employer]   Social History Main Topics  . Smoking status: Never Smoker  . Smokeless tobacco: Never Used  . Alcohol use No  . Drug use: Unknown  . Sexual activity: Not on file     History  Smoking Status  .  Never Smoker  Smokeless Tobacco  . Never Used    History  Alcohol Use No     Allergies  Allergen Reactions  . Pollen Extract Other (See Comments)    Runny nose, Itchy Eyes    Current Outpatient Prescriptions  Medication Sig Dispense Refill  . Cetirizine HCl (ZYRTEC ALLERGY) 10 MG CAPS Take 1 tablet by mouth daily.    [name] CIALIS 5 MG tablet Take 5 mg by mouth daily.  2  . triprolidine-pseudoephedrine (APRODINE) 2.5-60 MG TABS tablet Take 1 tablet by mouth every 6 (six) hours as needed for allergies.     No current facility-administered medications for this visit.     Pertinent items are noted in HPI.   Review of Systems:     Cardiac Review of Systems: Y or N  Chest Pain [ n   ]  Resting SOB [  n ] Exertional SOB  [ n ]  Orthopnea [  ]   Pedal Edema [ n  ]    Palpitations [n  ] Syncope  [n  ]   Presyncope [   ]  General Review of Systems: [Y] = yes [  ]=no Constitional: recent weight change [  ];  Wt loss over the last 3 months [   ] anorexia [  ]; fatigue [  ]; nausea [  ]; night sweats [  ]; fever [  ]; or chills [  ];          Dental: poor dentition[ n ]; Last Dentist visit:   Eye : blurred vision [  ]; diplopia [   ]; vision changes [   ];  Amaurosis fugax[  ]; Resp: cough [ n ];  wheezing[n  ];  hemoptysis[n  ]; shortness of breath[  ]; paroxysmal nocturnal dyspnea[  ]; dyspnea on exertion[  ]; or orthopnea[  ];  GI:  gallstones[  ], vomiting[  ];  dysphagia[  ]; melena[  ];  hematochezia [  ]; heartburn[  ];   Hx of  Colonoscopy[  ]; GU: kidney stones [  ]; hematuria[  ];   dysuria [  ];  nocturia[  ];  history of     obstruction [  ]; urinary frequency [  ]             Skin: rash, swelling[  ];, hair loss[  ];  peripheral edema[  ];  or itching[  ]; Musculosketetal: myalgias[  ];  joint swelling[  ];  joint erythema[  ];  joint pain[  ];  back pain[  ];  Heme/Lymph: bruising[  ];  bleeding[  ];  anemia[  ];  Neuro: TIA[ n ];  headaches[  ];  stroke[  n];  vertigo[  ];  seizures[  ];   paresthesias[  ];  difficulty walking[  ];  Psych:depression[  ]; anxiety[  ];  Endocrine: diabetes[ n ];  thyroid dysfunction[  n];  Immunizations: Flu up to date [ y ]; Pneumococcal up to date [ n ];  Other:  Physical Exam: BP 117/84   Pulse 85   Ht 5\' 9"  (1.753 m)   Wt 210 lb (95.3 kg)   SpO2 99%   BMI 31.01 kg/m   PHYSICAL EXAMINATION: General appearance: alert and cooperative Head: Normocephalic, without obvious abnormality, atraumatic Neck: no adenopathy, no carotid bruit, no JVD, supple, symmetrical, trachea midline and thyroid not enlarged, symmetric, no tenderness/mass/nodules Lymph nodes: Cervical, supraclavicular, and axillary nodes normal. Resp: clear to  auscultation bilaterally Back: symmetric, no curvature. ROM normal. No CVA tenderness. Cardio: regular rate and rhythm, S1, S2 normal, no murmur, click, rub or gallop GI: soft, non-tender; bowel sounds normal; no masses,  no organomegaly Extremities: extremities normal, atraumatic, no cyanosis or edema and Homans sign is negative, no sign of DVT Neurologic: Grossly normal   Diagnostic Studies & Laboratory data:   ECHO:07/26/2017: Echocardiography  Patient:     Adrian, Murphy MR #:       [id number] Study Date: 07/26/2017 Gender:     M Age:        51 Height:     175.3 cm Weight:     94.6 kg BSA:        2.17 m^2 Pt. Status: Room:   ATTENDING    Adrian Murphy  ORDERING     Adrian Murphy  REFERRING    Adrian Murphy  SONOGRAPHER  Dewitt Hoes, RDCS  PERFORMING   [facility], Outpatient  cc:  ------------------------------------------------------------------- LV EF: 55% -   60%  ------------------------------------------------------------------- Indications:      Thoracic aortic aneurysm (I71.2).  ------------------------------------------------------------------- History:   Risk factors:  Thoracic aortic aneurysm.  ------------------------------------------------------------------- Study Conclusions  - Left ventricle: The cavity size was normal. Systolic function was   normal. The estimated ejection fraction was in the range of 55%   to 60%. Wall motion was normal; there were no regional wall   motion abnormalities. Left ventricular diastolic function   parameters were normal. - Aortic valve: Moderately calcified annulus. Probable bicuspid;   mildly thickened, mildly calcified leaflets. There was trivial   regurgitation. Mean gradient (S): 11 mm Hg. Valve area (VTI):   2.27 cm^2. - Aorta: Ascending aortic diameter: 44 mm (S). - Ascending aorta: The ascending aorta was mildly dilated. - Pulmonary arteries: Systolic pressure could not be accurately   estimated.  Impressions:  - Normal LVF with EF 55-60%, poorly visualized AV but likely   bicuspid with moderate aortic annular calcification and mildly   thickened AV leaflets with no AS. There is trivial AI. Mild to   moderately dilated ascending aorta at 4.4cm.  ------------------------------------------------------------------- Study data:  No prior study was available for comparison.  Study status:  Routine.  Procedure:  The patient reported no pain pre or post test.  Transthoracic echocardiography. Image quality was adequate.  Study completion:  There were no complications. Echocardiography.  M-mode, complete 2D, 3D, spectral Doppler, and color Doppler.  Birthdate:  Patient birthdate: 1966/11/16.  Age: Patient is 51 yr old.  Sex:  Gender: male.    BMI: 30.8 kg/m^2. Blood pressure:     90/60  Patient status:  Outpatient.  Study date:  Study date: 07/26/2017. Study time: 03:55 PM.  Location: [facility]  -------------------------------------------------------------------  ------------------------------------------------------------------- Left ventricle:  The cavity size was normal. Systolic function was normal. The estimated ejection fraction was in the range of 55% to 60%. Wall motion was normal; there were no regional wall motion abnormalities. The transmitral flow pattern was normal. The deceleration time of the early transmitral flow velocity was normal. The pulmonary vein flow pattern was normal. The tissue Doppler parameters were normal. Left ventricular diastolic function parameters were normal.  ------------------------------------------------------------------- Aortic valve:  Poorly visualized. Moderately calcified annulus. Probable bicuspid; mildly thickened, mildly calcified leaflets. Mobility was not restricted.  Doppler:  Transvalvular velocity was within the normal range. There was no stenosis. There was trivial regurgitation.    VTI ratio of LVOT to aortic valve: 0.4. Valve area (VTI):  2.27 cm^2. Indexed valve area (VTI): 1.04 cm^2/m^2. Peak velocity ratio of LVOT to aortic valve: 0.37. Valve area (Vmax): 2.13 cm^2. Indexed valve area (Vmax): 0.98 cm^2/m^2. Mean velocity ratio of LVOT to aortic valve: 0.41. Valve area (Vmean): 2.33 cm^2. Indexed valve area (Vmean): 1.07 cm^2/m^2.    Mean gradient (S): 11 mm Hg. Peak gradient (S): 19 mm Hg.  ------------------------------------------------------------------- Aorta:   Aortic root: The aortic root was normal in size. Ascending aorta: The ascending aorta was mildly dilated.  ------------------------------------------------------------------- Mitral valve:   Structurally normal valve.   Mobility was not restricted.  Doppler:  Transvalvular velocity was within the normal range. There was no evidence for stenosis. There was no regurgitation.    Peak gradient (D): 4 mm Hg.  ------------------------------------------------------------------- Left atrium:  The atrium was normal in size.  ------------------------------------------------------------------- Right ventricle:  The cavity size was normal. Wall thickness was normal. Systolic function was normal.  ------------------------------------------------------------------- Pulmonic valve:    Structurally normal valve.   Cusp separation was normal.  Doppler:  Transvalvular velocity was within the normal range. There was no evidence for stenosis. There was no regurgitation.  ------------------------------------------------------------------- Tricuspid valve:   Structurally normal valve.    Doppler: Transvalvular velocity was within the normal range. There was no regurgitation.  ------------------------------------------------------------------- Pulmonary artery:   The main pulmonary artery was normal-sized. Systolic pressure could not be accurately estimated.  ------------------------------------------------------------------- Right atrium:  The atrium was normal in size.  ------------------------------------------------------------------- Pericardium:  There was no pericardial effusion.  ------------------------------------------------------------------- Systemic veins: Inferior vena cava: The vessel was normal in size.  ------------------------------------------------------------------- Measurements   Left ventricle                            Value          Reference  LV ID, ED,  PLAX chordal                   45.4  mm       43 - 52  LV ID, ES, PLAX chordal                   36.9  mm       23 - 38  LV fx shortening, PLAX chordal    (L)     19    %        >=29  LV PW thickness, ED                       8.62  mm       ---------  IVS/LV PW ratio, ED                       0.92           <=1.3  Stroke volume, 2D                         99    ml       ---------  Stroke volume/bsa, 2D                     46    ml/m^2   ---------  LV e&', lateral                            12.7  cm/s     ---------  LV E/e&', lateral                          7.87           ---------  LV e&', medial                             9.57  cm/s     ---------  LV E/e&', medial                           10.44          ---------  LV e&', average                            11.14 cm/s     ---------  LV E/e&', average                          8.97           ---------    Ventricular septum                        Value          Reference  IVS thickness, ED                         7.94  mm       ---------    LVOT                                      Value          Reference  LVOT ID, S                                27    mm       ---------  LVOT area                                 5.73  cm^2     ---------  LVOT peak velocity, S                     81.6  cm/s     ---------  LVOT mean velocity, S                     61.4  cm/s     ---------  LVOT VTI, S                               17.3  cm       ---------    Aortic valve                              Value          Reference  Aortic valve peak velocity, S             220   cm/s     ---------  Aortic valve mean velocity, S             151   cm/s     ---------  Aortic valve VTI, S                       43.7  cm       ---------  Aortic mean gradient, S                   11    mm Hg    ---------  Aortic peak gradient, S                   19    mm Hg    ---------  VTI ratio, LVOT/AV                        0.4            ---------  Aortic valve area, VTI                     2.27  cm^2     ---------  Aortic valve area/bsa, VTI                1.04  cm^2/m^2 ---------  Velocity ratio, peak, LVOT/AV             0.37           ---------  Aortic valve area, peak velocity          2.13  cm^2     ---------  Aortic valve area/bsa, peak               0.98  cm^2/m^2 ---------  velocity  Velocity ratio, mean, LVOT/AV             0.41           ---------  Aortic valve area, mean velocity          2.33  cm^2     ---------  Aortic valve area/bsa, mean               1.07  cm^2/m^2 ---------  velocity    Aorta                                     Value          Reference  Aortic root ID, ED                        36    mm       ---------  Ascending aorta ID, A-P, S                44    mm       ---------    Left atrium                               Value          Reference  LA ID, A-P, ES                            30    mm       ---------  LA ID/bsa, A-P  1.38  cm/m^2   <=2.2  LA volume, S                              45.7  ml       ---------  LA volume/bsa, S                          21    ml/m^2   ---------  LA volume, ES, 1-p A4C                    38.8  ml       ---------  LA volume/bsa, ES, 1-p A4C                17.9  ml/m^2   ---------  LA volume, ES, 1-p A2C                    45.5  ml       ---------  LA volume/bsa, ES, 1-p A2C                20.9  ml/m^2   ---------    Mitral valve                              Value          Reference  Mitral E-wave peak velocity               99.9  cm/s     ---------  Mitral A-wave peak velocity               65.5  cm/s     ---------  Mitral deceleration time          (L)     148   ms       150 - 230  Mitral peak gradient, D                   4     mm Hg    ---------  Mitral E/A ratio, peak                    1.5            ---------    Systemic veins                            Value          Reference  Estimated CVP                             3     mm Hg    ---------  Legend: (L)   and  (H)  mark values outside specified reference range.  ------------------------------------------------------------------- Prepared and Electronically Authenticated by  Armanda Magic, MD 2018-08-07T18:15:47   Recent Radiology Findings:  Ct Coronary Morph W/cta Cor W/score W/ca W/cm &/or Wo/cm  Addendum Date: 10/12/2017   ADDENDUM REPORT: 10/12/2017 16:48 CLINICAL DATA:  51 year old male with bicuspid aortic valve and ascending aortic aneurysm. EXAM: Cardiac/Coronary  CT TECHNIQUE: The patient was scanned on a [employer]. FINDINGS: A 120 kV prospective scan was triggered in the descending thoracic aorta at 111 HU's. Axial non-contrast 3 mm slices were carried out  through the heart. The data set was analyzed on a dedicated work station and scored using the Agatson method. Gantry rotation speed was 250 msecs and collimation was .6 mm. No beta blockade and 0.8 mg of sl NTG was given. The 3D data set was reconstructed in 5% intervals of the 67-82 % of the R-R cycle. Diastolic phases were analyzed on a dedicated work station using MPR, MIP and VRT modes. The patient received 80 cc of contrast. Aortic Valve: Bicuspid with non-separated right and left coronary leaflets. Mild leaflet calcifications. Maximum diameter at the sinus level 41 mm. Aorta: Aneurysmal dilatation of the ascending aorta with maximum diameter 45 x 45 mm, normal size of the aortic arch and descending thoracic aorta. No calcifications or dissection. Sinotubular junction:  35 x 35 mm Coronary Arteries:  Normal coronary origin.  Right dominance. RCA is a large dominant artery that gives rise to PDA and PLVB. There is no plaque. Left main is a large artery that gives rise to LAD and LCX arteries. LAD is a large vessel that gives rise to two diagonal arteries and has no plaque. LCX is a non-dominant artery that gives rise to two large OM1 branches. There is no plaque. Other findings: Normal pulmonary vein drainage into the left  atrium. Normal let atrial appendage without a thrombus. Normal size of the pulmonary artery. IMPRESSION: 1. Coronary calcium score of 0. This was 0 percentile for age and sex matched control. 2. Normal coronary origin with right dominance. 3. No evidence of CAD. 4. Bicuspid with non-separated right and left coronary leaflets. Mild leaflet calcifications. Maximum diameter at the sinus level 41 mm. 5. Aneurysmal dilatation of the ascending aorta with maximum diameter 45 mm, normal size of the aortic arch and descending thoracic aorta. No calcifications or dissection. 6. No other congenital anomalies were identified. 7. No thrombus in the left atrial appendage. 8. Normal size of the pulmonary artery. Tobias Alexander Electronically Signed   By: Tobias Alexander   On: 10/12/2017 16:48   Result Date: 10/12/2017 EXAM: OVER-READ INTERPRETATION  CT CHEST The following report is an over-read performed by radiologist Dr. Royal Piedra [facility] on 10/12/2017. This over-read does not include interpretation of cardiac or coronary anatomy or pathology. The coronary calcium score/coronary CTA interpretation by the cardiologist is attached. COMPARISON:  Coronary calcium score 06/13/2017. FINDINGS: Mild aneurysmal dilatation of the ascending thoracic aorta which measures up to 4.5 cm in diameter. Previously noted 3 mm subpleural nodule in the left upper lobe is now sub mm in size. Previously noted subpleural right middle lobe nodule has slightly decreased in size measuring only 4 mm (axial image 43 of series 13), considered benign, likely a subpleural lymph node. Within the visualized portions of the thorax there are no larger more suspicious appearing pulmonary nodules or masses, there is no acute consolidative airspace disease, no pleural effusions, no pneumothorax and no lymphadenopathy. Visualized portions of the upper abdomen are unremarkable. There are no aggressive appearing lytic or blastic lesions  noted in the visualized portions of the skeleton. IMPRESSION: 1. Mild aneurysmal dilatation of the ascending thoracic aorta measuring 4.5 cm in diameter. Ascending thoracic aortic aneurysm. Recommend semi-annual imaging followup by CTA or MRA and referral to cardiothoracic surgery if not already obtained. This recommendation follows 2010 ACCF/AHA/AATS/ACR/ASA/SCA/SCAI/SIR/STS/SVM Guidelines for the Diagnosis and Management of Patients With Thoracic Aortic Disease. Circulation. 2010; 121: N829-F621. 2. Previously noted tiny pulmonary nodules have decreased in size and are considered benign requiring no future imaging  followup. Electronically Signed: By: Trudie Reed M.D. On: 10/12/2017 09:37         CLINICAL DATA:  Cliffton Asters male with family history of heart disease.  EXAM: CT HEART FOR CALCIUM SCORING  TECHNIQUE: CT heart was performed on a 64 channel system using prospective ECG gating.  A non-contrast exam for calcium scoring was performed.  Note that this exam targets the heart and the chest was not imaged in its entirety.  COMPARISON:  None.  FINDINGS: Technical quality: Good.  CORONARY CALCIUM  Total Agatston Score: 0  MESA database percentile:  0  OTHER FINDINGS:  Cardiovascular: Aneurysm of the ascending thoracic aorta measuring up to 4.5 cm. Calcifications at the aortic root and aortic valve. No coronary artery calcifications. Normal caliber of the main pulmonary artery. Trace amount of pericardial fluid.  Mediastinum/Nodes: No lymph node enlargement in the visualized mediastinum. Visualized esophagus is unremarkable.  Lungs/Pleura: 5 mm nodule along the right minor fissure on sequence 4, image 10. No large pleural effusions. 4 mm pleural-based nodule in the left upper lobe on sequence 4, image 1.  Upper Abdomen: Negative  Musculoskeletal: No acute bone abnormality.  IMPRESSION: Coronary calcium score is 0.  Aneurysm of the ascending  thoracic aorta measuring up to 4.5 cm. Ascending thoracic aortic aneurysm. Recommend semi-annual imaging followup by CTA or MRA and referral to cardiothoracic surgery if not already obtained. This recommendation follows 2010 ACCF/AHA/AATS/ACR/ASA/SCA/SCAI/SIR/STS/SVM Guidelines for the Diagnosis and Management of Patients With Thoracic Aortic Disease. Circulation. 2010; 121: Z610-R604  Small indeterminate pulmonary nodules. No follow-up needed if patient is low-risk (and has no known or suspected primary neoplasm). Non-contrast chest CT can be considered in 12 months if patient is high-risk. This recommendation follows the consensus statement: Guidelines for Management of Incidental Pulmonary Nodules Detected on CT Images: From the Fleischner Society 2017; Radiology 2017; 284:228-243.   Electronically Signed   By: Richarda Overlie M.D.   On: 06/13/2017 14:09 I have independently reviewed the above radiologic studies.  Recent Lab Findings: Lab Results  Component Value Date   WBC 8.1 11/19/2010   HGB 14.5 11/19/2010   HCT 40.7 11/19/2010   PLT 302 11/19/2010   GLUCOSE 92 03/06/2009   NA 137 03/06/2009   K 3.8 03/06/2009   CL 104 03/06/2009   CREATININE 0.92 03/06/2009   BUN 13 03/06/2009   CO2 24 03/06/2009   Aortic Size Index=   4.5      /Body surface area is 2.15 meters squared. = 2.14 < 2.75 cm/m2      4% risk per year 2.75 to 4.25          8% risk per year > 4.25 cm/m2    20% risk per year     Assessment / Plan:   Probable bicuspid Aortic Valve based on gated CT scan and echocardiogram 4.5 cm. Ascending thoracic aortic aneurysm-stable since June 2018 without evidence of arch aneurysm Possible Family History of sudden death, ? Aortic dissection   Reviewed with patient dx of bicuspid aortic valve and dilated  Plan to get MRA of the chest in 6 months.    Patient was warned about not using Cipro and similar antibiotics. Recent studies have raised concern that  fluoroquinolone antibiotics could be associated with an increased risk of aortic aneurysm Fluoroquinolones have non-antimicrobial properties that might jeopardise the integrity of the extracellular matrix of the vascular wall In a  propensity score matched cohort study in [place], there was a 66% increased rate of aortic  aneurysm or dissection associated with oral fluoroquinolone use, compared with amoxicillin use, within a 60 day risk period from start of treatment  The [employer] of Cardiology [facility]) and the [place] Heart Association (AHA) have issued a statement to clarify 2 previous guidelines from the [facility], AHA, and collaborating societies addressing the risk of aortic dissection in patients with bicuspid aortic valves (BAV) and severe aortic enlargement. The 2 guidelines differ with regard to the recommended threshold of aortic root or ascending aortic dilatation that would justify surgical intervention in patients with bicuspid aortic valves. This new statement of clarification uses the ACC/AHA revised structure for delineating the Class of Recommendation and Level of Evidence to provide recommendation that replace those contained in Section 9.2.2.1 of the thoracic aortic disease guidelines and Section 5.1.3 of the valvular heart disease guideline. New recommendations in intervention in patients with BAV and dilatation of the aortic root (sinuses) or ascending aorta include:  . Operative intervention to repair or replace the aortic root (sinuses) or replace the ascending aorta is indicated in asymptomatic patients with BAV if the diameter of the aortic root or ascending aorta is 5.5 cm or greater. (Class of recommendation 1, Level of evidence B-NR).  [name] Operative intervention to repair or replace the aortic root (sinuses) or replace the ascending aorta is reasonable in asymptomatic patients with BAV if the diameter of the aortic root or ascending aorta is 5.0 cm or greater and an additional risk  factor for dissection is present or if the patient is at low surgical risk and the surgery is performed by an experienced aortic surgical team in a center with established expertise in these procedures. (Class of recommendation IIa; Level of Evidence B-NR).  . Replacement of the ascending aorta is reasonable in patients with BAV undergoing AVR because of severe aortic stenosis or aortic regurgitation when the diameter of the ascending aorta is greater than 4.5 cm (Class of recommendation IIa; Level of evidence C-EO).  Citation: Gae Bon, Kristen Loader, et al. Surgery for aortic dilatation in patients with bicuspid aortic valves. A statement of clarification from the [employer]/American Heart Association Task Force on Clinical Practice Guidelines. [Published online ahead of print November 22, 2014]. Circulation. doi: 10.1161/CIR.0000000000000331.  I  spent 30 minutes counseling including answering numerous questions about potential surgery timing of surgery results from surgery.  Patient also had questions that his sister had sent him the patient face to face and 50% or more the  time was spent in counseling and coordination of care. The total time spent in the appointment was 40 minutes.I  spent {Liberato Stansbery Bari Sherline Eberwein MD      [address] [place] [address] Office [phone]   Beeper [phone]  10/20/2017 11:13 AM

## 2017-11-09 DIAGNOSIS — L738 Other specified follicular disorders: Secondary | ICD-10-CM | POA: Diagnosis not present

## 2017-11-09 DIAGNOSIS — L814 Other melanin hyperpigmentation: Secondary | ICD-10-CM | POA: Diagnosis not present

## 2017-11-09 DIAGNOSIS — D225 Melanocytic nevi of trunk: Secondary | ICD-10-CM | POA: Diagnosis not present

## 2018-03-20 DIAGNOSIS — J019 Acute sinusitis, unspecified: Secondary | ICD-10-CM | POA: Diagnosis not present

## 2018-04-06 ENCOUNTER — Other Ambulatory Visit: Payer: Self-pay | Admitting: Cardiothoracic Surgery

## 2018-04-06 DIAGNOSIS — I712 Thoracic aortic aneurysm, without rupture, unspecified: Secondary | ICD-10-CM

## 2018-05-01 ENCOUNTER — Ambulatory Visit
Admission: RE | Admit: 2018-05-01 | Discharge: 2018-05-01 | Disposition: A | Discharge status: Home / Self Care | Payer: BLUE CROSS/BLUE SHIELD | Source: Ambulatory Visit | Attending: Cardiothoracic Surgery | Admitting: Cardiothoracic Surgery

## 2018-05-01 DIAGNOSIS — I712 Thoracic aortic aneurysm, without rupture, unspecified: Secondary | ICD-10-CM

## 2018-05-01 MED ORDER — GADOBENATE DIMEGLUMINE 529 MG/ML IV SOLN
19.0000 mL | Freq: Once | INTRAVENOUS | Status: AC | PRN
  Administered 2018-05-01: 19 mL via INTRAVENOUS

## 2018-05-09 ENCOUNTER — Ambulatory Visit: Payer: BLUE CROSS/BLUE SHIELD | Admitting: Cardiothoracic Surgery

## 2018-05-11 ENCOUNTER — Ambulatory Visit: Payer: BLUE CROSS/BLUE SHIELD | Admitting: Cardiothoracic Surgery

## 2018-05-11 ENCOUNTER — Other Ambulatory Visit: Payer: Self-pay

## 2018-05-11 ENCOUNTER — Encounter: Payer: Self-pay | Admitting: Cardiothoracic Surgery

## 2018-05-11 VITALS — BP 104/78 | HR 91 | Resp 18 | Ht 69.0 in | Wt 200.0 lb

## 2018-05-11 DIAGNOSIS — I712 Thoracic aortic aneurysm, without rupture, unspecified: Secondary | ICD-10-CM

## 2018-05-11 NOTE — Progress Notes (Signed)
[address]       [place] [address]             [phone]                    Adrian Murphy [place] Medical Record #[id number] Date of Birth: 05-17-1966  Referring: Martha Clan, MD Primary Care: Martha Clan, MD Cardiology: Dr Jens Som  Chief Complaint:    Chief Complaint  Patient presents with  . Thoracic Aortic Aneurysm    6 month f/u with MRA Chest 05/02/2018    History of Present Illness:    Patient is a 52 year old male seen in office today for evaluation and follow-up of a dilated a sending aorta.  Patient was initially seen 6 months ago based on a limited CT scan can done June 2018 for calcium scoring.  His calcium score was 0 but he was noted to have a 4.5 cm ascending aortic aneurysm.  An echocardiogram suggested a bicuspid valve.  A full CTA of the chest dated was done for further evaluation of the valve and thoracic aorta.  Patient's family history is significant for his father who died at age 63 about 2 hours after sudden collapse while at his desk working.  He was presumed he had a myocardial infarction but no definite diagnosis was made.  His paternal grandfather died in his early 1s of sudden collapse while walking down the street.  Again no definite diagnosis was made.  He has 2 sisters who are in their 32s who have been evaluated for dilated aorta's does not know the details.  Patient has 3 children the oldest is 53 who was married last weekend.  They have never been evaluated for cardiac issues.     Current Activity/ Functional Status:  Patient is independent with mobility/ambulation, transfers, ADL's, IADL's.   Zubrod Score: At the time of surgery this patient's most appropriate activity status/level should be described as:     0    Normal activity, no symptoms     1    Restricted in physical strenuous activity but ambulatory, able to do out light work     2    Ambulatory and capable of self care, unable to do work  activities, up and about               >50 % of waking hours                                  3    Only limited self care, in bed greater than 50% of waking hours     4    Completely disabled, no self care, confined to bed or chair     5    Moribund   Past Medical History:  Diagnosis Date  . Thoracic aortic aneurysm [facility]     Past Surgical History:  Procedure Laterality Date  . CERVICAL DISC SURGERY      Family History  Problem Relation Age of Onset  . Alzheimer's disease Mother   . CAD Father   . Stroke Paternal Grandmother   . Heart disease Paternal Grandfather     See above     Social History   Social History  . Marital status: Married    Spouse name: N/A  . Number of children: 4  . Years of education: N/A   Occupational History  .  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 Smoking status: Never Smoker  . Smokeless tobacco: Never Used  . Alcohol use No  . Drug use: Unknown  . Sexual activity: Not on file     Social History   Tobacco Use  Smoking Status Never Smoker  Smokeless Tobacco Never Used    Social History   Substance and Sexual Activity  Alcohol Use No     Allergies  Allergen Reactions  . Pollen Extract Other (See Comments)    Runny nose, Itchy Eyes    Current Outpatient Medications  Medication Sig Dispense Refill  . Cetirizine HCl (ZYRTEC ALLERGY) 10 MG CAPS Take 1 tablet by mouth daily.    . fluticasone (FLONASE) 50 MCG/ACT nasal spray Place 2 sprays into both nostrils daily.    . sildenafil (REVATIO) 20 MG tablet TAKE 1-5 TABLETS BY MOUTH AS NEEDED  6  . triprolidine-pseudoephedrine (APRODINE) 2.5-60 MG TABS tablet Take 1 tablet by mouth every 6 (six) hours as needed for allergies.     No current facility-administered medications for this visit.     Pertinent items are noted in HPI.    Review of Systems:     Cardiac Review of Systems: Y or N  Chest Pain [  n  ]  Resting SOB [   n] Exertional  SOB  [n  ]  Orthopnea Milo.Brash ]   Pedal Edema n[   ]    Palpitations [ n ] Syncope  [n  ]   Presyncope [ n  ]  General Review of Systems: [Y] = yes [  ]=no Constitional: recent weight change [  ]; anorexia [  ]; fatigue [  ]; nausea [  ]; night sweats [  ]; fever [  ]; or chills [  ];                                                                                                                                          Dental: poor dentition[  ]; Last Dentist visit: Regular dental care  Eye : blurred vision [  ]; diplopia [   ]; vision changes [  ];  Amaurosis fugax[  ]; Resp: cough [  ];  wheezing[  ];  hemoptysis[  ]; shortness of breath[  ]; paroxysmal nocturnal dyspnea[  ]; dyspnea on exertion[  ]; or orthopnea[  ];  GI:  gallstones[  ], vomiting[  ];  dysphagia[  ]; melena[  ];  hematochezia [  ]; heartburn[  ];   Hx of  Colonoscopy[  ]; GU: kidney stones [  ]; hematuria[  ];   dysuria [  ];  nocturia[  ];  history of     obstruction [  ];                 Skin: rash, swelling[  ];,  hair loss[  ];  peripheral edema[  ];  or itching[  ]; Musculosketetal: myalgias[  ];  joint swelling[  ];  joint erythema[  ];  joint pain[  ];  back pain[  ];  Heme/Lymph: bruising[  ];  bleeding[  ];  anemia[  ];  Neuro: TIA[  ];  headaches[  ];  stroke[  ];  vertigo[  ];  seizures[  ];   paresthesias[  ];  difficulty walking[  ];  Psych:depression[  ]; anxiety[  ];  Endocrine: diabetes[  ];  thyroid dysfunction[  ];  Immunizations: Flu Cove.Etienne  ]; Pneumococcal[y  ];   Physical Exam: BP 104/78 (BP Location: Left Arm, Patient Position: Sitting, Cuff Size: Normal)   Pulse 91   Resp 18   Ht  (1.753 m)   Wt 200 lb (90.7 kg)   SpO2 98% Comment: RA  BMI 29.53 kg/m   PHYSICAL EXAMINATION: General appearance: alert, cooperative, appears stated age and no distress Head: Normocephalic, without obvious abnormality, atraumatic Neck: no adenopathy, no carotid bruit, no JVD, supple, symmetrical, trachea midline and  thyroid not enlarged, symmetric, no tenderness/mass/nodules Lymph nodes: Cervical, supraclavicular, and axillary nodes normal. Resp: clear to auscultation bilaterally Back: symmetric, no curvature. ROM normal. No CVA tenderness. Cardio: regular rate and rhythm, S1, S2 normal, no murmur, click, rub or gallop GI: soft, non-tender; bowel sounds normal; no masses,  no organomegaly Extremities: extremities normal, atraumatic, no cyanosis or edema Neurologic: Grossly normal Patient has no stigmata of Marfan's or other congenital aortopathy.   Diagnostic Studies & Laboratory data:   ECHO:07/26/2017: Echocardiography  Patient:    Murdock, Jellison MR #:       [id number] Study Date: 07/26/2017 Gender:     M Age:        51 Height:     175.3 cm Weight:     94.6 kg BSA:        2.17 m^2 Pt. Status: Room:   ATTENDING    Olga Millers  ORDERING     Arlys John Crenshaw  REFERRING    Olga Millers  SONOGRAPHER  Dewitt Hoes, RDCS  PERFORMING   [facility], Outpatient  cc:  ------------------------------------------------------------------- LV EF: 55% -   60%  ------------------------------------------------------------------- Indications:      Thoracic aortic aneurysm (I71.2).  ------------------------------------------------------------------- History:   Risk factors:  Thoracic aortic aneurysm.  ------------------------------------------------------------------- Study Conclusions  - Left ventricle: The cavity size was normal. Systolic function was   normal. The estimated ejection fraction was in the range of 55%   to 60%. Wall motion was normal; there were no regional wall   motion abnormalities. Left ventricular diastolic function   parameters were normal. - Aortic valve: Moderately calcified annulus. Probable bicuspid;   mildly thickened, mildly calcified leaflets. There was trivial   regurgitation. Mean gradient (S): 11 mm Hg. Valve area (VTI):   2.27 cm^2. - Aorta: Ascending  aortic diameter: 44 mm (S). - Ascending aorta: The ascending aorta was mildly dilated. - Pulmonary arteries: Systolic pressure could not be accurately   estimated.  Impressions:  - Normal LVF with EF 55-60%, poorly visualized AV but likely   bicuspid with moderate aortic annular calcification and mildly   thickened AV leaflets with no AS. There is trivial AI. Mild to   moderately dilated ascending aorta at 4.4cm.  ------------------------------------------------------------------- Study data:  No prior study was available for comparison.  Study status:  Routine.  Procedure:  The patient reported no pain pre or post test.  Transthoracic echocardiography. Image quality was adequate.  Study completion:  There were no complications. Echocardiography.  M-mode, complete 2D, 3D, spectral Doppler, and color Doppler.  Birthdate:  Patient birthdate: 06-13-1966.  Age: Patient is 52 yr old.  Sex:  Gender: male.    BMI: 30.8 kg/m^2. Blood pressure:     90/60  Patient status:  Outpatient.  Study date:  Study date: 07/26/2017. Study time: 03:55 PM.  Location: [facility]  -------------------------------------------------------------------  ------------------------------------------------------------------- Left ventricle:  The cavity size was normal. Systolic function was normal. The estimated ejection fraction was in the range of 55% to 60%. Wall motion was normal; there were no regional wall motion abnormalities. The transmitral flow pattern was normal. The deceleration time of the early transmitral flow velocity was normal. The pulmonary vein flow pattern was normal. The tissue Doppler parameters were normal. Left ventricular diastolic function parameters were normal.  ------------------------------------------------------------------- Aortic valve:  Poorly visualized. Moderately calcified annulus. Probable bicuspid; mildly thickened, mildly calcified leaflets. Mobility was not  restricted.  Doppler:  Transvalvular velocity was within the normal range. There was no stenosis. There was trivial regurgitation.    VTI ratio of LVOT to aortic valve: 0.4. Valve area (VTI): 2.27 cm^2. Indexed valve area (VTI): 1.04 cm^2/m^2. Peak velocity ratio of LVOT to aortic valve: 0.37. Valve area (Vmax): 2.13 cm^2. Indexed valve area (Vmax): 0.98 cm^2/m^2. Mean velocity ratio of LVOT to aortic valve: 0.41. Valve area (Vmean): 2.33 cm^2. Indexed valve area (Vmean): 1.07 cm^2/m^2.    Mean gradient (S): 11 mm Hg. Peak gradient (S): 19 mm Hg.  ------------------------------------------------------------------- Aorta:  Aortic root: The aortic root was normal in size. Ascending aorta: The ascending aorta was mildly dilated.  ------------------------------------------------------------------- Mitral valve:   Structurally normal valve.   Mobility was not restricted.  Doppler:  Transvalvular velocity was within the normal range. There was no evidence for stenosis. There was no regurgitation.    Peak gradient (D): 4 mm Hg.  ------------------------------------------------------------------- Left atrium:  The atrium was normal in size.  ------------------------------------------------------------------- Right ventricle:  The cavity size was normal. Wall thickness was normal. Systolic function was normal.  ------------------------------------------------------------------- Pulmonic valve:    Structurally normal valve.   Cusp separation was normal.  Doppler:  Transvalvular velocity was within the normal range. There was no evidence for stenosis. There was no regurgitation.  ------------------------------------------------------------------- Tricuspid valve:   Structurally normal valve.    Doppler: Transvalvular velocity was within the normal range. There was no regurgitation.  ------------------------------------------------------------------- Pulmonary artery:   The main  pulmonary artery was normal-sized. Systolic pressure could not be accurately estimated.  ------------------------------------------------------------------- Right atrium:  The atrium was normal in size.  ------------------------------------------------------------------- Pericardium:  There was no pericardial effusion.  ------------------------------------------------------------------- Systemic veins: Inferior vena cava: The vessel was normal in size.  ------------------------------------------------------------------- Measurements   Left ventricle                            Value          Reference  LV ID, ED, PLAX chordal                   45.4  mm       43 - 52  LV ID, ES, PLAX chordal                   36.9  mm       23 - 38  LV fx shortening, PLAX  chordal    (L)     19    %        >=29  LV PW thickness, ED                       8.62  mm       ---------  IVS/LV PW ratio, ED                       0.92           <=1.3  Stroke volume, 2D                         99    ml       ---------  Stroke volume/bsa, 2D                     46    ml/m^2   ---------  LV e&', lateral                            12.7  cm/s     ---------  LV E/e&', lateral                          7.87           ---------  LV e&', medial                             9.57  cm/s     ---------  LV E/e&', medial                           10.44          ---------  LV e&', average                            11.14 cm/s     ---------  LV E/e&', average                          8.97           ---------    Ventricular septum                        Value          Reference  IVS thickness, ED                         7.94  mm       ---------    LVOT                                      Value          Reference  LVOT ID, S                                27    mm       ---------  LVOT area  5.73  cm^2     ---------  LVOT peak velocity, S                     81.6  cm/s     ---------  LVOT  mean velocity, S                     61.4  cm/s     ---------  LVOT VTI, S                               17.3  cm       ---------    Aortic valve                              Value          Reference  Aortic valve peak velocity, S             220   cm/s     ---------  Aortic valve mean velocity, S             151   cm/s     ---------  Aortic valve VTI, S                       43.7  cm       ---------  Aortic mean gradient, S                   11    mm Hg    ---------  Aortic peak gradient, S                   19    mm Hg    ---------  VTI ratio, LVOT/AV                        0.4            ---------  Aortic valve area, VTI                    2.27  cm^2     ---------  Aortic valve area/bsa, VTI                1.04  cm^2/m^2 ---------  Velocity ratio, peak, LVOT/AV             0.37           ---------  Aortic valve area, peak velocity          2.13  cm^2     ---------  Aortic valve area/bsa, peak               0.98  cm^2/m^2 ---------  velocity  Velocity ratio, mean, LVOT/AV             0.41           ---------  Aortic valve area, mean velocity          2.33  cm^2     ---------  Aortic valve area/bsa, mean               1.07  cm^2/m^2 ---------  velocity    Aorta  Value          Reference  Aortic root ID, ED                        36    mm       ---------  Ascending aorta ID, A-P, S                44    mm       ---------    Left atrium                               Value          Reference  LA ID, A-P, ES                            30    mm       ---------  LA ID/bsa, A-P                            1.38  cm/m^2   <=2.2  LA volume, S                              45.7  ml       ---------  LA volume/bsa, S                          21    ml/m^2   ---------  LA volume, ES, 1-p A4C                    38.8  ml       ---------  LA volume/bsa, ES, 1-p A4C                17.9  ml/m^2   ---------  LA volume, ES, 1-p A2C                    45.5  ml        ---------  LA volume/bsa, ES, 1-p A2C                20.9  ml/m^2   ---------    Mitral valve                              Value          Reference  Mitral E-wave peak velocity               99.9  cm/s     ---------  Mitral A-wave peak velocity               65.5  cm/s     ---------  Mitral deceleration time          (L)     148   ms       150 - 230  Mitral peak gradient, D                   4     mm Hg    ---------  Mitral E/A ratio, peak  1.5            ---------    Systemic veins                            Value          Reference  Estimated CVP                             3     mm Hg    ---------  Legend: (L)  and  (H)  mark values outside specified reference range.  ------------------------------------------------------------------- Prepared and Electronically Authenticated by  Armanda Magic, MD 2018-08-07T18:15:47   Recent Radiology Findings:  Mr Angiogram Chest W Wo Contrast  Result Date: 05/02/2018 CLINICAL DATA:  52 year old male with bicuspid aortic valve and thoracic aortic aneurysm. EXAM: MRA CHEST WITH OR WITHOUT CONTRAST TECHNIQUE: Angiographic images of the chest were obtained using MRA technique 19 intravenous contrast. CONTRAST:  19mL MULTIHANCE GADOBENATE DIMEGLUMINE 529 MG/ML IV SOLN COMPARISON:  None. FINDINGS: VASCULAR Aorta: Unchanged appearance of the course, caliber, and contour of the thoracic aorta. Diameter measured today in the ascending aorta is approximately 4.5cm. No evidence of dissection flap. No peri-aortic fluid. No dilation of the descending thoracic aorta. Three-vessel arch with patent branch vessels. Proximal cervical arteries demonstrate maintained flow signal. Heart: Heart size unremarkable. No peri-cardial effusion/thickening. Pulmonary Arteries: Unremarkable. NON-VASCULAR Unremarkable lungs. Unremarkable musculoskeletal elements. Surgical changes of the cervical region. Unremarkable upper abdomen. IMPRESSION: Unchanged size of the  ascending aorta in this patient with known bicuspid aortic valve. Greatest diameter 4.5 cm. Aortic aneurysm NOS (ICD10-I71.9). Recommend semi-annual imaging followup by CTA or MRA and referral to cardiothoracic surgery if not already obtained. This recommendation follows 2010 ACCF/AHA/AATS/ACR/ASA/SCA/SCAI/SIR/STS/SVM Guidelines for the Diagnosis and Management of Patients With Thoracic Aortic Disease. Circulation. 2010; 121: Z610-R604 Electronically Signed   By: Gilmer Mor D.O.   On: 05/02/2018 07:54    Ct Coronary Morph W/cta Cor W/score W/ca W/cm &/or Wo/cm  Addendum Date: 10/12/2017   ADDENDUM REPORT: 10/12/2017 16:48 CLINICAL DATA:  52 year old male with bicuspid aortic valve and ascending aortic aneurysm. EXAM: Cardiac/Coronary  CT TECHNIQUE: The patient was scanned on a [employer]. FINDINGS: A 120 kV prospective scan was triggered in the descending thoracic aorta at 111 HU's. Axial non-contrast 3 mm slices were carried out through the heart. The data set was analyzed on a dedicated work station and scored using the Agatson method. Gantry rotation speed was 250 msecs and collimation was .6 mm. No beta blockade and 0.8 mg of sl NTG was given. The 3D data set was reconstructed in 5% intervals of the 67-82 % of the R-R cycle. Diastolic phases were analyzed on a dedicated work station using MPR, MIP and VRT modes. The patient received 80 cc of contrast. Aortic Valve: Bicuspid with non-separated right and left coronary leaflets. Mild leaflet calcifications. Maximum diameter at the sinus level 41 mm. Aorta: Aneurysmal dilatation of the ascending aorta with maximum diameter 45 x 45 mm, normal size of the aortic arch and descending thoracic aorta. No calcifications or dissection. Sinotubular junction:  35 x 35 mm Coronary Arteries:  Normal coronary origin.  Right dominance. RCA is a large dominant artery that gives rise to PDA and PLVB. There is no plaque. Left main is a large artery that gives  rise to LAD and LCX arteries. LAD is a large vessel that  gives rise to two diagonal arteries and has no plaque. LCX is a non-dominant artery that gives rise to two large OM1 branches. There is no plaque. Other findings: Normal pulmonary vein drainage into the left atrium. Normal let atrial appendage without a thrombus. Normal size of the pulmonary artery. IMPRESSION: 1. Coronary calcium score of 0. This was 0 percentile for age and sex matched control. 2. Normal coronary origin with right dominance. 3. No evidence of CAD. 4. Bicuspid with non-separated right and left coronary leaflets. Mild leaflet calcifications. Maximum diameter at the sinus level 41 mm. 5. Aneurysmal dilatation of the ascending aorta with maximum diameter 45 mm, normal size of the aortic arch and descending thoracic aorta. No calcifications or dissection. 6. No other congenital anomalies were identified. 7. No thrombus in the left atrial appendage. 8. Normal size of the pulmonary artery. Tobias Alexander Electronically Signed   By: Tobias Alexander   On: 10/12/2017 16:48   Result Date: 10/12/2017 EXAM: OVER-READ INTERPRETATION  CT CHEST The following report is an over-read performed by radiologist Dr. Royal Piedra [facility] on 10/12/2017. This over-read does not include interpretation of cardiac or coronary anatomy or pathology. The coronary calcium score/coronary CTA interpretation by the cardiologist is attached. COMPARISON:  Coronary calcium score 06/13/2017. FINDINGS: Mild aneurysmal dilatation of the ascending thoracic aorta which measures up to 4.5 cm in diameter. Previously noted 3 mm subpleural nodule in the left upper lobe is now sub mm in size. Previously noted subpleural right middle lobe nodule has slightly decreased in size measuring only 4 mm (axial image 43 of series 13), considered benign, likely a subpleural lymph node. Within the visualized portions of the thorax there are no larger more suspicious  appearing pulmonary nodules or masses, there is no acute consolidative airspace disease, no pleural effusions, no pneumothorax and no lymphadenopathy. Visualized portions of the upper abdomen are unremarkable. There are no aggressive appearing lytic or blastic lesions noted in the visualized portions of the skeleton. IMPRESSION: 1. Mild aneurysmal dilatation of the ascending thoracic aorta measuring 4.5 cm in diameter. Ascending thoracic aortic aneurysm. Recommend semi-annual imaging followup by CTA or MRA and referral to cardiothoracic surgery if not already obtained. This recommendation follows 2010 ACCF/AHA/AATS/ACR/ASA/SCA/SCAI/SIR/STS/SVM Guidelines for the Diagnosis and Management of Patients With Thoracic Aortic Disease. Circulation. 2010; 121: Z610-R604. 2. Previously noted tiny pulmonary nodules have decreased in size and are considered benign requiring no future imaging followup. Electronically Signed: By: Trudie Reed M.D. On: 10/12/2017 09:37         CLINICAL DATA:  Cliffton Asters male with family history of heart disease.  EXAM: CT HEART FOR CALCIUM SCORING  TECHNIQUE: CT heart was performed on a 64 channel system using prospective ECG gating.  A non-contrast exam for calcium scoring was performed.  Note that this exam targets the heart and the chest was not imaged in its entirety.  COMPARISON:  None.  FINDINGS: Technical quality: Good.  CORONARY CALCIUM  Total Agatston Score: 0  MESA database percentile:  0  OTHER FINDINGS:  Cardiovascular: Aneurysm of the ascending thoracic aorta measuring up to 4.5 cm. Calcifications at the aortic root and aortic valve. No coronary artery calcifications. Normal caliber of the main pulmonary artery. Trace amount of pericardial fluid.  Mediastinum/Nodes: No lymph node enlargement in the visualized mediastinum. Visualized esophagus is unremarkable.  Lungs/Pleura: 5 mm nodule along the right minor fissure on sequence 4,  image 10. No large pleural effusions. 4 mm pleural-based nodule in the left upper  lobe on sequence 4, image 1.  Upper Abdomen: Negative  Musculoskeletal: No acute bone abnormality.  IMPRESSION: Coronary calcium score is 0.  Aneurysm of the ascending thoracic aorta measuring up to 4.5 cm. Ascending thoracic aortic aneurysm. Recommend semi-annual imaging followup by CTA or MRA and referral to cardiothoracic surgery if not already obtained. This recommendation follows 2010 ACCF/AHA/AATS/ACR/ASA/SCA/SCAI/SIR/STS/SVM Guidelines for the Diagnosis and Management of Patients With Thoracic Aortic Disease. Circulation. 2010; 121: Z610-R604  Small indeterminate pulmonary nodules. No follow-up needed if patient is low-risk (and has no known or suspected primary neoplasm). Non-contrast chest CT can be considered in 12 months if patient is high-risk. This recommendation follows the consensus statement: Guidelines for Management of Incidental Pulmonary Nodules Detected on CT Images: From the Fleischner Society 2017; Radiology 2017; 284:228-243.   Electronically Signed   By: Richarda Overlie M.D.   On: 06/13/2017 14:09 I have independently reviewed the above radiologic studies.  Recent Lab Findings: Lab Results  Component Value Date   WBC 8.1 11/19/2010   HGB 14.5 11/19/2010   HCT 40.7 11/19/2010   PLT 302 11/19/2010   GLUCOSE 92 03/06/2009   NA 137 03/06/2009   K 3.8 03/06/2009   CL 104 03/06/2009   CREATININE 0.92 03/06/2009   BUN 13 03/06/2009   CO2 24 03/06/2009   Aortic Size Index=   4.5      /Body surface area is 2.1 meters squared. = 2.14 < 2.75 cm/m2      4% risk per year 2.75 to 4.25          8% risk per year > 4.25 cm/m2    20% risk per year     Assessment / Plan:   Probable bicuspid aortic valve based on gated CT scan and echocardiogram with a stable 4.5 cm a sending aorta without significant aortic insufficiency or aortic stenosis. Patient has a positive family  history of sudden death in both his father and paternal grandfather-no definite diagnosis but consistent with acute aortic dissections Again reviewed with the patient the diagnosis of dilated aorta in conjunction with probable bicuspid aortic valve-we discussed screening of his children with echocardiograms. We will repeat echocardiogram and MRI of the chest in 6 months.     Patient was warned about not using Cipro and similar antibiotics. Recent studies have raised concern that fluoroquinolone antibiotics could be associated with an increased risk of aortic aneurysm Fluoroquinolones have non-antimicrobial properties that might jeopardise the integrity of the extracellular matrix of the vascular wall In a  propensity score matched cohort study in [place], there was a 66% increased rate of aortic aneurysm or dissection associated with oral fluoroquinolone use, compared with amoxicillin use, within a 60 day risk period from start of treatment  The [employer] [facility]) and the [place] Heart Association (AHA) have issued a statement to clarify 2 previous guidelines from the [facility], AHA, and collaborating societies addressing the risk of aortic dissection in patients with bicuspid aortic valves (BAV) and severe aortic enlargement. The 2 guidelines differ with regard to the recommended threshold of aortic root or ascending aortic dilatation that would justify surgical intervention in patients with bicuspid aortic valves. This new statement of clarification uses the ACC/AHA revised structure for delineating the Class of Recommendation and Level of Evidence to provide recommendation that replace those contained in Section 9.2.2.1 of the thoracic aortic disease guidelines and Section 5.1.3 of the valvular heart disease guideline. New recommendations in intervention in patients with BAV and dilatation of the  aortic root (sinuses) or ascending aorta include:  . Operative intervention to repair or  replace the aortic root (sinuses) or replace the ascending aorta is indicated in asymptomatic patients with BAV if the diameter of the aortic root or ascending aorta is 5.5 cm or greater. (Class of recommendation 1, Level of evidence B-NR).  [name] Operative intervention to repair or replace the aortic root (sinuses) or replace the ascending aorta is reasonable in asymptomatic patients with BAV if the diameter of the aortic root or ascending aorta is 5.0 cm or greater and an additional risk factor for dissection is present or if the patient is at low surgical risk and the surgery is performed by an experienced aortic surgical team in a center with established expertise in these procedures. (Class of recommendation IIa; Level of Evidence B-NR).  . Replacement of the ascending aorta is reasonable in patients with BAV undergoing AVR because of severe aortic stenosis or aortic regurgitation when the diameter of the ascending aorta is greater than 4.5 cm (Class of recommendation IIa; Level of evidence C-EO).  Citation: Gae Bon, Kristen Loader, et al. Surgery for aortic dilatation in patients with bicuspid aortic valves. A statement of clarification from the [employer]/American Heart Association Task Force on Clinical Practice Guidelines. [Published online ahead of print November 22, 2014]. Circulation. doi: 10.1161/CIR.0000000000000331.   Delight Ovens MD      [address] ,[place] [address] Office [phone]   Beeper [phone]  05/11/2018 7:02 PM

## 2018-06-07 DIAGNOSIS — Z125 Encounter for screening for malignant neoplasm of prostate: Secondary | ICD-10-CM | POA: Diagnosis not present

## 2018-06-07 DIAGNOSIS — Z0000 Encounter for general adult medical examination without abnormal findings: Secondary | ICD-10-CM | POA: Diagnosis not present

## 2018-06-07 DIAGNOSIS — R7301 Impaired fasting glucose: Secondary | ICD-10-CM | POA: Diagnosis not present

## 2018-06-14 DIAGNOSIS — I712 Thoracic aortic aneurysm, without rupture: Secondary | ICD-10-CM | POA: Diagnosis not present

## 2018-06-14 DIAGNOSIS — M25552 Pain in left hip: Secondary | ICD-10-CM | POA: Diagnosis not present

## 2018-06-14 DIAGNOSIS — N528 Other male erectile dysfunction: Secondary | ICD-10-CM | POA: Diagnosis not present

## 2018-06-14 DIAGNOSIS — R7301 Impaired fasting glucose: Secondary | ICD-10-CM | POA: Diagnosis not present

## 2018-06-14 DIAGNOSIS — Z1389 Encounter for screening for other disorder: Secondary | ICD-10-CM | POA: Diagnosis not present

## 2018-06-14 DIAGNOSIS — Z0000 Encounter for general adult medical examination without abnormal findings: Secondary | ICD-10-CM | POA: Diagnosis not present

## 2018-06-28 ENCOUNTER — Ambulatory Visit (INDEPENDENT_AMBULATORY_CARE_PROVIDER_SITE_OTHER): Payer: Self-pay | Admitting: Orthopaedic Surgery

## 2018-07-13 ENCOUNTER — Ambulatory Visit (INDEPENDENT_AMBULATORY_CARE_PROVIDER_SITE_OTHER): Payer: BLUE CROSS/BLUE SHIELD | Admitting: Orthopaedic Surgery

## 2018-07-13 ENCOUNTER — Ambulatory Visit (INDEPENDENT_AMBULATORY_CARE_PROVIDER_SITE_OTHER): Payer: BLUE CROSS/BLUE SHIELD

## 2018-07-13 ENCOUNTER — Encounter (INDEPENDENT_AMBULATORY_CARE_PROVIDER_SITE_OTHER): Payer: Self-pay | Admitting: Orthopaedic Surgery

## 2018-07-13 ENCOUNTER — Other Ambulatory Visit (INDEPENDENT_AMBULATORY_CARE_PROVIDER_SITE_OTHER): Payer: Self-pay

## 2018-07-13 DIAGNOSIS — M25552 Pain in left hip: Secondary | ICD-10-CM

## 2018-07-13 NOTE — Progress Notes (Signed)
Office Visit Note   Patient: Adrian Murphy           Date of Birth: [birth year]           MRN: [id number] Visit Date: 07/13/2018              Requested by: Martha Clan, MD [address] [place], [place] [address] PCP: Martha Clan, MD   Assessment & Plan: Visit Diagnoses:  1. Pain in left hip     Plan: I do feel that he does have some evidence of femoral acetabular impingement.  He is a perfect candidate to try an intra-articular steroid injection in the left hip joint under direct fluoroscopy.  We will see if we can get this set up through Dr. Alvester Morin.  All questions concerns were answered and addressed.  I will see him back myself in 4 weeks to see how is done with the injection.  He will avoid walking up and down hills on his exercise walks.  He said flat surfaces do not cause any issues.  Follow-Up Instructions: Return in about 1 month (around 08/10/2018).   Orders:  Orders Placed This Encounter  Procedures  . XR HIP UNILAT W OR W/O PELVIS 1V LEFT   No orders of the defined types were placed in this encounter.     Procedures: No procedures performed   Clinical Data: No additional findings.   Subjective: Chief Complaint  Patient presents with  . Left Hip - Pain  Patient is a very pleasant 52 year old gentleman who comes in with chief complaint of 6 months of left hip pain that hurts mainly in the groin.  He says and his wife exercise walk several days a week.  He never is running at all.  Several minutes into the walk he starts getting pain in the groin.  It does hurt with pivoting activities as well.  He denies any specific injury.  Is been aggravating type of pain.  He has been evaluated for hernia and was negative for that.  He says he can put his shoe and sock on easily and cross his legs and denies any right hip pain or back issues at all.  HPI  Review of Systems He currently denies any headache, chest pain, shortness of breath, fever, chills,  nausea, vomiting.  Objective: Vital Signs: There were no vitals taken for this visit.  Physical Exam Is alert and oriented x3 and in no acute distress Ortho Exam Examination of his right hip is normal examination of his left hip shows fluid and full range of motion but he does have pain in the left hip joint at the extremes of rotation.  Compression of the joint does not cause any problems. Specialty Comments:  No specialty comments available.  Imaging: Xr Hip Unilat W Or W/o Pelvis 1v Left  Result Date: 07/13/2018 An AP pelvis and lateral left hip shows just slight joint space narrowing when comparing the left and right hips.  There is slight evidence of femoral acetabular impingement.    PMFS History: There are no active problems to display for this patient.  Past Medical History:  Diagnosis Date  . Thoracic aortic aneurysm (HCC)     Family History  Problem Relation Age of Onset  . Alzheimer's disease Mother   . CAD Father   . Stroke Paternal Grandmother   . Heart disease Paternal Grandfather     Past Surgical History:  Procedure Laterality Date  . CERVICAL  DISC SURGERY     Social History   Occupational History  . Not on file  Tobacco Use  . Smoking status: Never Smoker  . Smokeless tobacco: Never Used  Substance and Sexual Activity  . Alcohol use: No  . Drug use: No  . Sexual activity: Yes

## 2018-07-21 ENCOUNTER — Ambulatory Visit (AMBULATORY_SURGERY_CENTER): Payer: Self-pay | Admitting: *Deleted

## 2018-07-21 VITALS — Ht 69.0 in | Wt 210.0 lb

## 2018-07-21 DIAGNOSIS — Z1211 Encounter for screening for malignant neoplasm of colon: Secondary | ICD-10-CM

## 2018-07-21 MED ORDER — PLENVU 140 G PO SOLR: 1.0000 | Freq: Once | ORAL | 0 refills | Status: AC

## 2018-07-21 NOTE — Progress Notes (Signed)
No egg or soy allergy  No home oxygen used or hx of sleep apnea  No diet medications taken  Registered in [place]  No anesthesia or intubation problems per pt

## 2018-08-01 ENCOUNTER — Encounter: Payer: Self-pay | Admitting: Gastroenterology

## 2018-08-08 ENCOUNTER — Ambulatory Visit (AMBULATORY_SURGERY_CENTER): Payer: BLUE CROSS/BLUE SHIELD | Admitting: Gastroenterology

## 2018-08-08 ENCOUNTER — Encounter: Payer: Self-pay | Admitting: Gastroenterology

## 2018-08-08 VITALS — BP 102/72 | HR 63 | Temp 99.1°F | Resp 17 | Ht 69.0 in | Wt 210.0 lb

## 2018-08-08 DIAGNOSIS — Z1211 Encounter for screening for malignant neoplasm of colon: Secondary | ICD-10-CM

## 2018-08-08 MED ORDER — SODIUM CHLORIDE 0.9 % IV SOLN: 500.0000 mL | Freq: Once | INTRAVENOUS | Status: DC

## 2018-08-08 NOTE — Progress Notes (Signed)
To PACU, vss patent aw report to rn 

## 2018-08-08 NOTE — Patient Instructions (Signed)
YOU HAD AN ENDOSCOPIC PROCEDURE TODAY AT THE [facility]:   Refer to the procedure report that was given to you for any specific questions about what was found during the examination.  If the procedure report does not answer your questions, please call your gastroenterologist to clarify.  If you requested that your care partner not be given the details of your procedure findings, then the procedure report has been included in a sealed envelope for you to review at your convenience later.  YOU SHOULD EXPECT: Some feelings of bloating in the abdomen. Passage of more gas than usual.  Walking can help get rid of the air that was put into your GI tract during the procedure and reduce the bloating. If you had a lower endoscopy (such as a colonoscopy or flexible sigmoidoscopy) you may notice spotting of blood in your stool or on the toilet paper. If you underwent a bowel prep for your procedure, you may not have a normal bowel movement for a few days.  Please Note:  You might notice some irritation and congestion in your nose or some drainage.  This is from the oxygen used during your procedure.  There is no need for concern and it should clear up in a day or so.  SYMPTOMS TO REPORT IMMEDIATELY:   Following lower endoscopy (colonoscopy or flexible sigmoidoscopy):  Excessive amounts of blood in the stool  Significant tenderness or worsening of abdominal pains  Swelling of the abdomen that is new, acute  Fever of 100F or higher     For urgent or emergent issues, a gastroenterologist can be reached at any hour by calling [phone].   DIET:  We do recommend a small meal at first, but then you may proceed to your regular diet.  Drink plenty of fluids but you should avoid alcoholic beverages for 24 hours.  ACTIVITY:  You should plan to take it easy for the rest of today and you should NOT DRIVE or use heavy machinery until tomorrow (because of the sedation medicines used during the test).     FOLLOW UP: Our staff will call the number listed on your records the next business day following your procedure to check on you and address any questions or concerns that you may have regarding the information given to you following your procedure. If we do not reach you, we will leave a message.  However, if you are feeling well and you are not experiencing any problems, there is no need to return our call.  We will assume that you have returned to your regular daily activities without incident.  If any biopsies were taken you will be contacted by phone or by letter within the next 1-3 weeks.  Please call us at [phone] if you have not heard about the biopsies in 3 weeks.    SIGNATURES/CONFIDENTIALITY: You and/or your care partner have signed paperwork which will be entered into your electronic medical record.  These signatures attest to the fact that that the information above on your After Visit Summary has been reviewed and is understood.  Full responsibility of the confidentiality of this discharge information lies with you and/or your care-partner.  Normal colonoscopy.  Recall 10 years-2029

## 2018-08-08 NOTE — Progress Notes (Signed)
Pt's states no medical or surgical changes since previsit or office visit. 

## 2018-08-08 NOTE — Op Note (Signed)
[facility] Patient Name: Adrian Murphy Procedure Date: 08/08/2018 2:30 PM MRN: [id number] Endoscopist: Sherilyn CooterHenry L. Myrtie Neitheranis , MD Age: 52 Referring MD:  Date of Birth: 07/15/1966 Gender: Male Account #: [id number] Procedure:                Colonoscopy Indications:              Screening for colorectal malignant neoplasm, This                            is the patient's first colonoscopy Medicines:                Monitored Anesthesia Care Procedure:                Pre-Anesthesia Assessment:                           - Prior to the procedure, a History and Physical                            was performed, and patient medications and                            allergies were reviewed. The patient's tolerance of                            previous anesthesia was also reviewed. The risks                            and benefits of the procedure and the sedation                            options and risks were discussed with the patient.                            All questions were answered, and informed consent                            was obtained. Prior Anticoagulants: The patient has                            taken no previous anticoagulant or antiplatelet                            agents. ASA Grade Assessment: II - A patient with                            mild systemic disease. After reviewing the risks                            and benefits, the patient was deemed in                            satisfactory condition to undergo the procedure.  After obtaining informed consent, the colonoscope                            was passed under direct vision. Throughout the                            procedure, the patient's blood pressure, pulse, and                            oxygen saturations were monitored continuously. The                            Colonoscope was introduced through the anus and                            advanced to the the cecum,  identified by                            appendiceal orifice and ileocecal valve. The                            colonoscopy was performed without difficulty. The                            patient tolerated the procedure well. The quality                            of the bowel preparation was excellent. The                            ileocecal valve, appendiceal orifice, and rectum                            were photographed. The quality of the bowel                            preparation was evaluated using the BBPS [facility]                            Bowel Preparation Scale) with scores of: Right                            Colon = 3, Transverse Colon = 3 and Left Colon = 3                            (entire mucosa seen well with no residual staining,                            small fragments of stool or opaque liquid). The                            total BBPS score equals 9. Scope In: 2:42:25 PM Scope Out: 2:55:06 PM Scope Withdrawal Time: 0 hours 9  minutes 0 seconds  Total Procedure Duration: 0 hours 12 minutes 41 seconds  Findings:                 The perianal and digital rectal examinations were                            normal.                           The entire examined colon appeared normal on direct                            and retroflexion views. Complications:            No immediate complications. Estimated Blood Loss:     Estimated blood loss: none. Impression:               - The entire examined colon is normal on direct and                            retroflexion views.                           - No specimens collected. Recommendation:           - Patient has a contact number available for                            emergencies. The signs and symptoms of potential                            delayed complications were discussed with the                            patient. Return to normal activities tomorrow.                            Written discharge  instructions were provided to the                            patient.                           - Resume previous diet.                           - Continue present medications.                           - Repeat colonoscopy in 10 years for screening                            purposes. Henry L. Myrtie Neitheranis, MD 08/08/2018 3:00:11 PM This report has been signed electronically.

## 2018-08-09 ENCOUNTER — Encounter (INDEPENDENT_AMBULATORY_CARE_PROVIDER_SITE_OTHER): Payer: Self-pay | Admitting: Physical Medicine and Rehabilitation

## 2018-08-09 ENCOUNTER — Telehealth: Payer: Self-pay

## 2018-08-09 ENCOUNTER — Ambulatory Visit (INDEPENDENT_AMBULATORY_CARE_PROVIDER_SITE_OTHER): Payer: BLUE CROSS/BLUE SHIELD | Admitting: Physical Medicine and Rehabilitation

## 2018-08-09 ENCOUNTER — Ambulatory Visit (INDEPENDENT_AMBULATORY_CARE_PROVIDER_SITE_OTHER): Payer: Self-pay

## 2018-08-09 DIAGNOSIS — M25552 Pain in left hip: Secondary | ICD-10-CM | POA: Diagnosis not present

## 2018-08-09 NOTE — Progress Notes (Signed)
Adrian Murphy - 52 y.o. male MRN [id number]  Date of birth: 02/26/1966  Office Visit Note: Visit Date: 08/09/2018 PCP: Martha ClanShaw, William, MD Referred by: Martha ClanShaw, William, MD  Subjective: Chief Complaint  Patient presents with  . Left Hip - Pain   HPI: Adrian Murphy is a 52 year old gentleman that comes in at the request of Dr. Doneen Poissonhristopher Blackman for diagnostic and hopefully therapeutic left anesthetic hip arthrogram.  Patient has left hip and groin pain during long walks and at least 80% of the time he gets pretty significant pain at that point.  He does not have much pain otherwise in terms of bending or putting on his shoes or crossing his legs.  His biggest complaint really is with a walking.   ROS Otherwise per HPI.  Assessment & Plan: Visit Diagnoses:  1. Pain in left hip     Plan: Findings:  Diagnostic note for therapeutic anesthetic arthrogram on the left.  Again patient really could not tell during the anesthetic phase because his pain really is only present with prolonged walks.  The patient is probably not can have the ability to do a prolonged walks a day during the anesthetic phase but we did talk at great length about when the cortisone medicine hopefully would kick in and help.  We talked about testing this out over the next 10 days.  He does have follow-up with Dr. Magnus IvanBlackman.    Meds & Orders: No orders of the defined types were placed in this encounter.   Orders Placed This Encounter  Procedures  . Large Joint Inj: L hip joint  . XR C-ARM NO REPORT    Follow-up: Return if symptoms worsen or fail to improve, for Dr. Magnus IvanBlackman.   Procedures: Large Joint Inj: L hip joint on 08/09/2018 1:13 PM Indications: pain and diagnostic evaluation Details: 22 G needle, anterior approach  Arthrogram: Yes  Medications: 3 mL bupivacaine 0.5 %; 80 mg triamcinolone acetonide 40 MG/ML Outcome: tolerated well, no immediate complications  Arthrogram demonstrated excellent flow of  contrast throughout the joint surface without extravasation or obvious defect.  Because the patient only has pain with prolonged walking which is several minutes into the walk he is really not go to be able to tell diagnostically during the anesthetic phase if he is doing much better. Procedure, treatment alternatives, risks and benefits explained, specific risks discussed. Consent was given by the patient. Immediately prior to procedure a time out was called to verify the correct patient, procedure, equipment, support staff and site/side marked as required. Patient was prepped and draped in the usual sterile fashion.      No notes on file   Clinical History: No specialty comments available.   He reports that he has never smoked. He has never used smokeless tobacco. No results for input(s): HGBA1C, LABURIC in the last 8760 hours.  Objective:  VS:  HT:    WT:   BMI:     BP:   HR: bpm  TEMP: ( )  RESP:  Physical Exam  Ortho Exam Imaging: Xr C-arm No Report  Result Date: 08/09/2018 Please see Notes tab for imaging impression.   Past Medical/Family/Surgical/Social History: Medications & Allergies reviewed per EMR, new medications updated. There are no active problems to display for this patient.  Past Medical History:  Diagnosis Date  . Allergy   . Thoracic aortic aneurysm (HCC)    Family History  Problem Relation Age of Onset  . Alzheimer's disease Mother   .  CAD Father   . Stroke Paternal Grandmother   . Heart disease Paternal Grandfather   . Ovarian cancer Maternal Grandmother   . Colon cancer Neg Hx   . Esophageal cancer Neg Hx   . Stomach cancer Neg Hx   . Rectal cancer Neg Hx    Past Surgical History:  Procedure Laterality Date  . CERVICAL DISC SURGERY     x3  . SIGMOIDOSCOPY     Social History   Occupational History  . Not on file  Tobacco Use  . Smoking status: Never Smoker  . Smokeless tobacco: Never Used  Substance and Sexual Activity  . Alcohol  use: No  . Drug use: No  . Sexual activity: Yes

## 2018-08-09 NOTE — Progress Notes (Signed)
 .  Numeric Pain Rating Scale and Functional Assessment Average Pain 7   In the last MONTH (on 0-10 scale) has pain interfered with the following?  1. General activity like being  able to carry out your everyday physical activities such as walking, climbing stairs, carrying groceries, or moving a chair?  Rating(5)  -Dye Allergies.  

## 2018-08-09 NOTE — Patient Instructions (Signed)

## 2018-08-09 NOTE — Telephone Encounter (Signed)
  Follow up Call-  Call back number 08/08/2018  Post procedure Call Back phone  # [phone]  Permission to leave phone message Yes  Some recent data might be hidden     Patient questions:  Do you have a fever, pain , or abdominal swelling? No. Pain Score  0 *  Have you tolerated food without any problems? Yes.    Have you been able to return to your normal activities? Yes.    Do you have any questions about your discharge instructions: Diet   No. Medications  No. Follow up visit  No.  Do you have questions or concerns about your Care? No.  Actions: * If pain score is 4 or above: No action needed, pain <4.

## 2018-08-10 MED ORDER — TRIAMCINOLONE ACETONIDE 40 MG/ML IJ SUSP
80.0000 mg | INTRAMUSCULAR | Status: AC | PRN
  Administered 2018-08-09: 80 mg via INTRA_ARTICULAR

## 2018-08-10 MED ORDER — BUPIVACAINE HCL 0.5 % IJ SOLN
3.0000 mL | INTRAMUSCULAR | Status: AC | PRN
  Administered 2018-08-09: 3 mL via INTRA_ARTICULAR

## 2018-08-14 ENCOUNTER — Ambulatory Visit (INDEPENDENT_AMBULATORY_CARE_PROVIDER_SITE_OTHER): Payer: BLUE CROSS/BLUE SHIELD | Admitting: Orthopaedic Surgery

## 2018-08-24 ENCOUNTER — Encounter (INDEPENDENT_AMBULATORY_CARE_PROVIDER_SITE_OTHER): Payer: Self-pay | Admitting: Orthopaedic Surgery

## 2018-08-24 ENCOUNTER — Ambulatory Visit (INDEPENDENT_AMBULATORY_CARE_PROVIDER_SITE_OTHER): Payer: BLUE CROSS/BLUE SHIELD | Admitting: Orthopaedic Surgery

## 2018-08-24 DIAGNOSIS — M25859 Other specified joint disorders, unspecified hip: Secondary | ICD-10-CM | POA: Insufficient documentation

## 2018-08-24 NOTE — Progress Notes (Signed)
The patient is a 52 year old who is following up after having an intra-articular steroid injection in his left hip by Dr. Alvester Morin due to pain from femoral acetabular impingement.  He says the injection is done well.  This was 2 weeks ago.  He still has not tested his hip yet in terms of the walking and hiking he likes to do.  On exam both hips move fluidly with pain left hip only in extreme of rotation.  His discomfort is very minimal.  At this point of encouraged him to test his hip in terms of a hiking like to do.  Follow-up will be as needed.  All question concerns were answered and addressed.  He is someone I would definitely order an MRI of the left hip if he continues to have pain so we can better fully assess the cartilage.

## 2018-10-30 ENCOUNTER — Other Ambulatory Visit: Payer: Self-pay | Admitting: *Deleted

## 2018-10-30 DIAGNOSIS — I712 Thoracic aortic aneurysm, without rupture, unspecified: Secondary | ICD-10-CM

## 2018-10-30 DIAGNOSIS — Q231 Congenital insufficiency of aortic valve: Secondary | ICD-10-CM

## 2018-11-08 ENCOUNTER — Other Ambulatory Visit: Payer: Self-pay | Admitting: *Deleted

## 2018-11-08 ENCOUNTER — Ambulatory Visit
Admission: RE | Admit: 2018-11-08 | Discharge: 2018-11-08 | Disposition: A | Discharge status: Home / Self Care | Payer: BLUE CROSS/BLUE SHIELD | Source: Ambulatory Visit | Attending: Cardiothoracic Surgery | Admitting: Cardiothoracic Surgery

## 2018-11-08 ENCOUNTER — Inpatient Hospital Stay: Admission: RE | Admit: 2018-11-08 | Payer: BLUE CROSS/BLUE SHIELD | Source: Ambulatory Visit

## 2018-11-08 DIAGNOSIS — I712 Thoracic aortic aneurysm, without rupture, unspecified: Secondary | ICD-10-CM

## 2018-11-08 MED ORDER — GADOBENATE DIMEGLUMINE 529 MG/ML IV SOLN: 19.0000 mL | Freq: Once | INTRAVENOUS | Status: DC | PRN

## 2018-11-08 MED ORDER — GADOBENATE DIMEGLUMINE 529 MG/ML IV SOLN
19.0000 mL | Freq: Once | INTRAVENOUS | Status: AC | PRN
  Administered 2018-11-08: 19 mL via INTRAVENOUS

## 2018-11-14 DIAGNOSIS — L570 Actinic keratosis: Secondary | ICD-10-CM | POA: Diagnosis not present

## 2018-11-14 DIAGNOSIS — L821 Other seborrheic keratosis: Secondary | ICD-10-CM | POA: Diagnosis not present

## 2018-11-14 DIAGNOSIS — D485 Neoplasm of uncertain behavior of skin: Secondary | ICD-10-CM | POA: Diagnosis not present

## 2018-11-14 DIAGNOSIS — D229 Melanocytic nevi, unspecified: Secondary | ICD-10-CM | POA: Diagnosis not present

## 2018-11-14 DIAGNOSIS — L819 Disorder of pigmentation, unspecified: Secondary | ICD-10-CM | POA: Diagnosis not present

## 2018-11-14 DIAGNOSIS — L814 Other melanin hyperpigmentation: Secondary | ICD-10-CM | POA: Diagnosis not present

## 2018-11-14 DIAGNOSIS — C4441 Basal cell carcinoma of skin of scalp and neck: Secondary | ICD-10-CM | POA: Diagnosis not present

## 2018-11-17 ENCOUNTER — Ambulatory Visit (HOSPITAL_COMMUNITY)
Admission: RE | Admit: 2018-11-17 | Discharge: 2018-11-17 | Disposition: A | Discharge status: Home / Self Care | Payer: BLUE CROSS/BLUE SHIELD | Source: Ambulatory Visit | Attending: Cardiothoracic Surgery | Admitting: Cardiothoracic Surgery

## 2018-11-17 DIAGNOSIS — I712 Thoracic aortic aneurysm, without rupture, unspecified: Secondary | ICD-10-CM

## 2018-11-17 DIAGNOSIS — Q231 Congenital insufficiency of aortic valve: Secondary | ICD-10-CM | POA: Diagnosis not present

## 2018-11-17 NOTE — Progress Notes (Signed)
  Echocardiogram 2D Echocardiogram has been performed.  Janalyn HarderWest, Melvie Paglia R 11/17/2018, 9:10 AM

## 2018-11-21 ENCOUNTER — Ambulatory Visit (HOSPITAL_COMMUNITY): Payer: BLUE CROSS/BLUE SHIELD

## 2018-11-23 ENCOUNTER — Ambulatory Visit: Payer: BLUE CROSS/BLUE SHIELD | Admitting: Cardiothoracic Surgery

## 2018-11-23 VITALS — BP 117/82 | HR 73 | Resp 20 | Ht 69.0 in | Wt 212.0 lb

## 2018-11-23 DIAGNOSIS — I712 Thoracic aortic aneurysm, without rupture, unspecified: Secondary | ICD-10-CM

## 2018-11-23 DIAGNOSIS — Q231 Congenital insufficiency of aortic valve: Secondary | ICD-10-CM

## 2018-11-23 NOTE — Progress Notes (Signed)
[address]       [place] [address]             [phone]                    Adrian Murphy Medical Record #[id number] Date of Birth: [birth year]  Referring: Martha Clan, MD Primary Care: Martha Clan, MD Cardiology: Dr Jens Som  Chief Complaint:    Chief Complaint  Patient presents with  . Thoracic Aortic Aneurysm    6 month f/u with MRA Chest 11/08/18    History of Present Illness:    Patient is a 52 year old male followed in the office for  dilated ascending aorta and bicuspid aortic valve.   Patient was initially seen last year months ago based on a limited CT scan  That was done  June 2018 for calcium scoring.  His calcium score was 0 but he was noted to have a 4.5 cm ascending aortic aneurysm.  An echocardiogram suggested a bicuspid valve.  A full CTA of the chest  was done for further evaluation of the valve and thoracic aorta.  He comes in today with a six-month follow-up MRI of the chest.  Patient's family history is significant for his father who died at age 41 about 2 hours after sudden collapse while working at his desk.[name]  He was presumed he had a myocardial infarction but no definite diagnosis was made.  His paternal grandfather died in his early 6s of sudden collapse while walking down the street.  Again no definite diagnosis was made.  He has 2 sisters who are in their 12s who have been evaluated for dilated aorta,   Current Activity/ Functional Status:  Patient is independent with mobility/ambulation, transfers, ADL's, IADL's.   Zubrod Score: At the time of surgery this patient's most appropriate activity status/level should be described as: [x]     0    Normal activity, no symptoms []     1    Restricted in physical strenuous activity but ambulatory, able to do out light work []     2    Ambulatory and capable of self care, unable to do work activities, up and about               >50 % of waking hours                               []     3    Only limited self care, in bed greater than 50% of waking hours []     4    Completely disabled, no self care, confined to bed or chair []     5    Moribund   Past Medical History:  Diagnosis Date  . Allergy   . Thoracic aortic aneurysm [facility])     Past Surgical History:  Procedure Laterality Date  . CERVICAL DISC SURGERY     x3  . SIGMOIDOSCOPY      Family History  Problem Relation Age of Onset  . Alzheimer's disease Mother   . CAD Father   . Stroke Paternal Grandmother   . Heart disease Paternal Grandfather   . Ovarian cancer Maternal Grandmother   . Colon cancer Neg Hx   . Esophageal cancer Neg Hx   . Stomach cancer Neg Hx   . Rectal cancer Neg Hx     See above  Social History   Social History  . Marital status: Married    Spouse name: N/A  . Number of children: 4  . Years of education: N/A   Occupational History  . 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 Smoking status: Never Smoker  . Smokeless tobacco: Never Used  . Alcohol use No  . Drug use: Unknown  . Sexual activity: Not on file     Social History   Tobacco Use  Smoking Status Never Smoker  Smokeless Tobacco Never Used    Social History   Substance and Sexual Activity  Alcohol Use No     Allergies  Allergen Reactions  . Pollen Extract Other (See Comments)    Runny nose, Itchy Eyes    Current Outpatient Medications  Medication Sig Dispense Refill  . Cetirizine HCl (ZYRTEC ALLERGY) 10 MG CAPS Take 1 tablet by mouth daily.    . fluticasone (FLONASE) 50 MCG/ACT nasal spray Place 2 sprays into both nostrils daily.    . sildenafil (REVATIO) 20 MG tablet TAKE 1-5 TABLETS BY MOUTH AS NEEDED  6  . triprolidine-pseudoephedrine (APRODINE) 2.5-60 MG TABS tablet Take 1 tablet by mouth every 6 (six) hours as needed for allergies.    [name] triprolidine-pseudoephedrine (APRODINE) 2.5-60 MG TABS tablet Take 1 tablet by mouth every 6 (six) hours as needed for  allergies (uses [facility] brand, only uses when Zyrtec isn't effective).     No current facility-administered medications for this visit.     Pertinent items are noted in HPI.    Review of Systems:     Cardiac Review of Systems: Y or N  Chest Pain [ N]  Resting SOB [N Exertional SOB  Klaus.Mock ]  Pollyann Kennedy Klaus.Mock ]   Pedal Edema [ N  ]    Palpitations [ N] Syncope  Klaus.Mock ]   Presyncope Klaus.Mock ]  General Review of Systems: [Y] = yes [  ]=no Constitional: recent weight change [  ]; anorexia [  ]; fatigue [  ]; nausea [  ]; night sweats [  ]; fever [  ]; or chills [  ];                                                                                                                                          Dental: poor dentition[  ]; Last Dentist visit: Regular dental care  Eye : blurred vision [  ]; diplopia [   ]; vision changes [  ];  Amaurosis fugax[  ]; Resp: cough [  ];  wheezing[  ];  hemoptysis[  ]; shortness of breath[  ]; paroxysmal nocturnal dyspnea[  ]; dyspnea on exertion[  ]; or orthopnea[  ];  GI:  gallstones[  ], vomiting[  ];  dysphagia[  ]; melena[  ];  hematochezia [  ]; heartburn[  ];  Hx of  Colonoscopy[  ]; GU: kidney stones [  ]; hematuria[  ];   dysuria [  ];  nocturia[  ];  history of     obstruction [  ];                 Skin: rash, swelling[  ];, hair loss[  ];  peripheral edema[  ];  or itching[  ]; Musculosketetal: myalgias[  ];  joint swelling[  ];  joint erythema[  ];  joint pain[  ];  back pain[  ];  Heme/Lymph: bruising[  ];  bleeding[  ];  anemia[  ];  Neuro: TIA[  ];  headaches[  ];  stroke[  ];  vertigo[  ];  seizures[  ];   paresthesias[  ];  difficulty walking[  ];  Psych:depression[  ]; anxiety[  ];  Endocrine: diabetes[  ];  thyroid dysfunction[  ];  Immunizations: Flu Cove.Etienne  ]; Pneumococcal[y  ];   Physical Exam: BP 117/82   Pulse 73   Resp 20   Ht 5\' 9"  (1.753 m)   Wt 212 lb (96.2 kg)   SpO2 99% Comment: RA  BMI 31.31 kg/m   PHYSICAL EXAMINATION: General  appearance: alert, cooperative and appears stated age Head: Normocephalic, without obvious abnormality, atraumatic Neck: no adenopathy, no carotid bruit, no JVD, supple, symmetrical, trachea midline and thyroid not enlarged, symmetric, no tenderness/mass/nodules Lymph nodes: Cervical, supraclavicular, and axillary nodes normal. Resp: clear to auscultation bilaterally Back: symmetric, no curvature. ROM normal. No CVA tenderness. Cardio: regular rate and rhythm, S1, S2 normal, no murmur, click, rub or gallop GI: soft, non-tender; bowel sounds normal; no masses,  no organomegaly Extremities: extremities normal, atraumatic, no cyanosis or edema Neurologic: Grossly normal Patient has no stigmata of Marfan's or other congenital aortopathy.   Diagnostic Studies & Laboratory data:    ECHO:10/2018 Echocardiography  Patient:    Doak, Mah MR #:       [id number] Study Date: 11/17/2018 Gender:     M Age:        52 Height:     175.3 cm Weight:     95.3 kg BSA:        2.18 m^2 Pt. Status: Room:   ATTENDING    Sheliah Plane MD  ORDERING     Sheliah Plane MD  REFERRING    Sheliah Plane MD  PERFORMING   [facility], Outpatient  SONOGRAPHER  Sheralyn Boatman  cc:  ------------------------------------------------------------------- LV EF: 55% -   60%  ------------------------------------------------------------------- Indications:      Aneurysm - thoracic 441.2.  ------------------------------------------------------------------- History:   PMH:  Bicuspid aortic valve.  ------------------------------------------------------------------- Study Conclusions  - Left ventricle: The cavity size was normal. Wall thickness was   increased in a pattern of mild LVH. Systolic function was normal.   The estimated ejection fraction was in the range of 55% to 60%.   Wall motion was normal; there were no regional wall motion   abnormalities. Left ventricular diastolic function parameters    were normal. - Aortic valve: Bicuspid valve. No significant stenosis. Mean   gradient (S): 11 mm Hg. Peak gradient (S): 19 mm Hg. Valve area   (VTI): 2.95 cm^2. Valve area (Vmax): 2.63 cm^2. Valve area   (Vmean): 2.62 cm^2. - Aorta: Ascending aortic diameter: 45 mm (S). - Ascending aorta: The ascending aorta was moderately dilated. - Left atrium: The atrium was normal in size. - Tricuspid valve: There was trivial regurgitation. - Pulmonary arteries: PA peak  pressure: 23 mm Hg (S). - Inferior vena cava: The vessel was normal in size. The   respirophasic diameter changes were in the normal range (>= 50%),   consistent with normal central venous pressure.  Impressions:  - Compared to a prior study in 2018, there are few changes. The   ascending aorta may be slightly larger at 4.5 cm.  ------------------------------------------------------------------- Study data:  Comparison was made to the study of 07/26/2017.  Study status:  Routine.  Procedure:  The patient reported no pain pre or post test. Transthoracic echocardiography. Image quality was adequate.  Study completion:  There were no complications. Echocardiography.  M-mode, complete 2D, spectral Doppler, and color Doppler.  Birthdate:  Patient birthdate: 03-01-1966.  Age:  Patient is 52 yr old.  Sex:  Gender: male.    BMI: 31 kg/m^2.  Blood pressure:     106/70  Patient status:  Outpatient.  Study date: Study date: 11/17/2018. Study time: 08:00 AM.  Location:  Echo laboratory.  -------------------------------------------------------------------  ------------------------------------------------------------------- Left ventricle:  The cavity size was normal. Wall thickness was increased in a pattern of mild LVH. Systolic function was normal. The estimated ejection fraction was in the range of 55% to 60%. Wall motion was normal; there were no regional wall motion abnormalities. The transmitral flow pattern was normal.  The deceleration time of the early transmitral flow velocity was normal. The pulmonary vein flow pattern was normal. The tissue Doppler parameters were normal. Left ventricular diastolic function parameters were normal.  ------------------------------------------------------------------- Aortic valve:  Bicuspid valve. No significant stenosis.  Doppler:   VTI ratio of LVOT to aortic valve: 0.65. Valve area (VTI): 2.95 cm^2. Indexed valve area (VTI): 1.35 cm^2/m^2. Peak velocity ratio of LVOT to aortic valve: 0.58. Valve area (Vmax): 2.63 cm^2. Indexed valve area (Vmax): 1.21 cm^2/m^2. Mean velocity ratio of LVOT to aortic valve: 0.58. Valve area (Vmean): 2.62 cm^2. Indexed valve area (Vmean): 1.2 cm^2/m^2.    Mean gradient (S): 11 mm Hg. Peak gradient (S): 19 mm Hg.  ------------------------------------------------------------------- Aorta:  Ascending aorta: The ascending aorta was moderately dilated.  ------------------------------------------------------------------- Mitral valve:   Structurally normal valve.   Leaflet separation was normal.  Doppler:  Transvalvular velocity was within the normal range. There was no evidence for stenosis. There was no regurgitation.    Valve area by pressure half-time: 3.33 cm^2. Indexed valve area by pressure half-time: 1.53 cm^2/m^2.    Peak gradient (D): 3 mm Hg.  ------------------------------------------------------------------- Left atrium:  The atrium was normal in size.  ------------------------------------------------------------------- Atrial septum:  No defect or patent foramen ovale was identified.   ------------------------------------------------------------------- Right ventricle:  The cavity size was normal. Wall thickness was normal. Systolic function was normal.  ------------------------------------------------------------------- Pulmonic valve:    The valve appears to be grossly normal. Doppler:  There was no  significant regurgitation.  ------------------------------------------------------------------- Tricuspid valve:   Doppler:  There was trivial regurgitation.  ------------------------------------------------------------------- Pulmonary artery:   The main pulmonary artery was normal-sized.  ------------------------------------------------------------------- Right atrium:  The atrium was normal in size.  ------------------------------------------------------------------- Pericardium:  There was no pericardial effusion.  ------------------------------------------------------------------- Systemic veins: Inferior vena cava: The vessel was normal in size. The respirophasic diameter changes were in the normal range (>= 50%), consistent with normal central venous pressure.  ------------------------------------------------------------------- Measurements   Left ventricle                           Value  Reference  LV ID, ED, PLAX chordal                  52    mm       43 - 52  LV ID, ES, PLAX chordal          (H)     39    mm       23 - 38  LV fx shortening, PLAX chordal   (L)     25    %        >=29  LV PW thickness, ED                      11    mm       ----------  IVS/LV PW ratio, ED                      1              <=1.3  Stroke volume, 2D                        140   ml       ----------  Stroke volume/bsa, 2D                    64    ml/m^2   ----------  LV ejection fraction, 1-p A4C            56    %        ----------  LV end-diastolic volume, 2-p             83    ml       ----------  LV end-systolic volume, 2-p              35    ml       ----------  LV ejection fraction, 2-p                58    %        ----------  Stroke volume, 2-p                       48    ml       ----------  LV end-diastolic volume/bsa, 2-p         38    ml/m^2   ----------  LV end-systolic volume/bsa, 2-p          16    ml/m^2   ----------  Stroke volume/bsa, 2-p                    22.1  ml/m^2   ----------  LV e&', lateral                           11    cm/s     ----------  LV E/e&', lateral                         8.46           ----------  LV e&', medial                            9.9   cm/s     ----------  LV  E/e&', medial                          9.4            ----------  LV e&', average                           10.45 cm/s     ----------  LV E/e&', average                         8.91           ----------    Ventricular septum                       Value          Reference  IVS thickness, ED                        11    mm       ----------    LVOT                                     Value          Reference  LVOT ID, S                               24    mm       ----------  LVOT area                                4.52  cm^2     ----------  LVOT peak velocity, S                    127   cm/s     ----------  LVOT mean velocity, S                    92    cm/s     ----------  LVOT VTI, S                              31    cm       ----------  LVOT peak gradient, S                    6     mm Hg    ----------    Aortic valve                             Value          Reference  Aortic valve peak velocity, S            218   cm/s     ----------  Aortic valve mean velocity, S            159   cm/s     ----------  Aortic valve VTI, S  47.5  cm       ----------  Aortic mean gradient, S                  11    mm Hg    ----------  Aortic peak gradient, S                  19    mm Hg    ----------  VTI ratio, LVOT/AV                       0.65           ----------  Aortic valve area, VTI                   2.95  cm^2     ----------  Aortic valve area/bsa, VTI               1.35  cm^2/m^2 ----------  Velocity ratio, peak, LVOT/AV            0.58           ----------  Aortic valve area, peak velocity         2.63  cm^2     ----------  Aortic valve area/bsa, peak              1.21  cm^2/m^2 ----------  velocity  Velocity ratio, mean, LVOT/AV             0.58           ----------  Aortic valve area, mean velocity         2.62  cm^2     ----------  Aortic valve area/bsa, mean              1.2   cm^2/m^2 ----------  velocity    Aorta                                    Value          Reference  Aortic root ID, ED                       36    mm       ----------  Ascending aorta ID, A-P, S               45    mm       ----------    Left atrium                              Value          Reference  LA ID, A-P, ES                           27    mm       ----------  LA ID/bsa, A-P                           1.24  cm/m^2   <=2.2  LA volume, S                             35.3  ml       ----------  LA volume/bsa, S                         16.2  ml/m^2   ----------  LA volume, ES, 1-p A4C                   24.7  ml       ----------  LA volume/bsa, ES, 1-p A4C               11.3  ml/m^2   ----------  LA volume, ES, 1-p A2C                   44.8  ml       ----------  LA volume/bsa, ES, 1-p A2C               20.5  ml/m^2   ----------    Mitral valve                             Value          Reference  Mitral E-wave peak velocity              93.1  cm/s     ----------  Mitral A-wave peak velocity              58.7  cm/s     ----------  Mitral deceleration time                 225   ms       150 - 230  Mitral pressure half-time                66    ms       ----------  Mitral peak gradient, D                  3     mm Hg    ----------  Mitral E/A ratio, peak                   1.6            ----------  Mitral valve area, PHT, DP               3.33  cm^2     ----------  Mitral valve area/bsa, PHT, DP           1.53  cm^2/m^2 ----------    Pulmonary arteries                       Value          Reference  PA pressure, S, DP                       23    mm Hg    <=30    Tricuspid valve                          Value          Reference  Tricuspid regurg peak velocity           222   cm/s     ----------  Tricuspid peak RV-RA gradient            20     mm Hg    ----------  Right atrium                             Value          Reference  RA ID, S-I, ES, A4C                      47    mm       34 - 49  RA area, ES, A4C                         15    cm^2     8.3 - 19.5  RA volume, ES, A/L                       40    ml       ----------  RA volume/bsa, ES, A/L                   18.3  ml/m^2   ----------    Systemic veins                           Value          Reference  Estimated CVP                            3     mm Hg    ----------    Right ventricle                          Value          Reference  TAPSE                                    28.7  mm       ----------  RV pressure, S, DP                       23    mm Hg    <=30  RV s&', lateral, S                        13.6  cm/s     ----------  Legend: (L)  and  (H)  mark values outside specified reference range.  ------------------------------------------------------------------- Prepared and Electronically Authenticated by  Zoila ShutterKenneth Hilty MD 2019-11-29T12:58:04 Echocardiography    Recent Radiology Findings:  Mr Angiogram Chest W Wo Contrast  Result Date: 11/08/2018 CLINICAL DATA:  Follow-up thoracic aortic aneurysm. History of bicuspid aortic valve EXAM: MRA CHEST WITH CONTRAST TECHNIQUE: Angiographic images of the chest were obtained using MRA technique with intravenous contrast. CONTRAST:  19 mL MULTIHANCE GADOBENATE DIMEGLUMINE 529 MG/ML IV SOLN COMPARISON:  05/01/2018; 10/12/2017; 06/13/2017 FINDINGS: Vascular Findings: Stable uncomplicated fusiform aneurysmal dilatation of the ascending thoracic aorta with measurements as follows. The thoracic aorta tapers to a normal caliber at the level of the aortic arch. Note is made of a small ductus diverticulum. The descending thoracic aorta is of normal caliber. Conventional configuration of the aortic arch. The branch vessels of the aortic arch appear widely patent throughout their imaged courses. Borderline cardiomegaly.   No pericardial  effusion. Although this examination was not tailored for the evaluation the pulmonary arteries, there are no discrete filling defects within the central pulmonary arterial tree to suggest central pulmonary embolism. Normal caliber of the main pulmonary artery. ------------------------------------------------------------- Thoracic aortic measurements: Sinotubular junction 37 mm as measured in greatest oblique sagittal dimension, though evaluation degraded secondary to pulsation artifact. Proximal ascending aorta 44 mm as measured in greatest oblique axial dimension at the level of the main pulmonary artery (image 53, series 16) and approximately 45 mm in greatest oblique short axis sagittal diameter (sagittal image 57, series 13), grossly unchanged compared to the 09/2017 examination, previously, 44 and 45 mm in diameter respectively. Aortic arch aorta 26 mm as measured in greatest oblique sagittal dimension. Proximal descending thoracic aorta 21 mm as measured in greatest oblique axial dimension at the level of the main pulmonary artery. Distal descending thoracic aorta 21 mm as measured in greatest oblique axial dimension at the level of the diaphragmatic hiatus. Review of the MIP images confirms the above findings. ------------------------------------------------------------- Non-Vascular Findings: Mediastinum/Lymph Nodes: No bulky mediastinal, hilar or axillary lymphadenopathy. Lungs/Pleura: Ill-defined slightly nodular/linear heterogeneous opacities are seen within the right lower lobe measuring approximately 1.0 x 0.6 cm (image 60, series 16). Minimal amount of adjacent presumed subsegmental atelectasis. No discrete focal airspace opacities. No pleural effusion. Upper abdomen: Limited evaluation of the upper abdomen is normal. Musculoskeletal: No definitive acute or aggressive osseous abnormalities. IMPRESSION: 1. Stable uncomplicated fusiform aneurysmal dilatation of the ascending thoracic aorta  measuring 45 mm in maximal diameter, unchanged compared to the 09/2017 examination. 2. Aortic aneurysm NOS (ICD10-I71.9). 3. Borderline cardiomegaly. 4. Ill-defined right lower lobe nodular/linear heterogeneous opacities, potentially atelectasis though incompletely evaluated on the present examination. Correlation for infectious symptoms is advised. Electronically Signed   By: Simonne Come M.D.   On: 11/08/2018 17:31     Mr Angiogram Chest W Wo Contrast  Result Date: 05/02/2018 CLINICAL DATA:  52 year old male with bicuspid aortic valve and thoracic aortic aneurysm. EXAM: MRA CHEST WITH OR WITHOUT CONTRAST TECHNIQUE: Angiographic images of the chest were obtained using MRA technique 19 intravenous contrast. CONTRAST:  19mL MULTIHANCE GADOBENATE DIMEGLUMINE 529 MG/ML IV SOLN COMPARISON:  None. FINDINGS: VASCULAR Aorta: Unchanged appearance of the course, caliber, and contour of the thoracic aorta. Diameter measured today in the ascending aorta is approximately 4.5cm. No evidence of dissection flap. No peri-aortic fluid. No dilation of the descending thoracic aorta. Three-vessel arch with patent branch vessels. Proximal cervical arteries demonstrate maintained flow signal. Heart: Heart size unremarkable. No peri-cardial effusion/thickening. Pulmonary Arteries: Unremarkable. NON-VASCULAR Unremarkable lungs. Unremarkable musculoskeletal elements. Surgical changes of the cervical region. Unremarkable upper abdomen. IMPRESSION: Unchanged size of the ascending aorta in this patient with known bicuspid aortic valve. Greatest diameter 4.5 cm. Aortic aneurysm NOS (ICD10-I71.9). Recommend semi-annual imaging followup by CTA or MRA and referral to cardiothoracic surgery if not already obtained. This recommendation follows 2010 ACCF/AHA/AATS/ACR/ASA/SCA/SCAI/SIR/STS/SVM Guidelines for the Diagnosis and Management of Patients With Thoracic Aortic Disease. Circulation. 2010; 121: Z610-R604 Electronically Signed   By: Gilmer Mor D.O.   On: 05/02/2018 07:54    Ct Coronary Morph W/cta Cor W/score W/ca W/cm &/or Wo/cm  Addendum Date: 10/12/2017   ADDENDUM REPORT: 10/12/2017 16:48 CLINICAL DATA:  52 year old male with bicuspid aortic valve and ascending aortic aneurysm. EXAM: Cardiac/Coronary  CT TECHNIQUE: The patient was scanned on a [employer]. FINDINGS: A 120 kV prospective scan was triggered in the descending thoracic aorta at 111 HU's. Axial non-contrast 3  mm slices were carried out through the heart. The data set was analyzed on a dedicated work station and scored using the Agatson method. Gantry rotation speed was 250 msecs and collimation was .6 mm. No beta blockade and 0.8 mg of sl NTG was given. The 3D data set was reconstructed in 5% intervals of the 67-82 % of the R-R cycle. Diastolic phases were analyzed on a dedicated work station using MPR, MIP and VRT modes. The patient received 80 cc of contrast. Aortic Valve: Bicuspid with non-separated right and left coronary leaflets. Mild leaflet calcifications. Maximum diameter at the sinus level 41 mm. Aorta: Aneurysmal dilatation of the ascending aorta with maximum diameter 45 x 45 mm, normal size of the aortic arch and descending thoracic aorta. No calcifications or dissection. Sinotubular junction:  35 x 35 mm Coronary Arteries:  Normal coronary origin.  Right dominance. RCA is a large dominant artery that gives rise to PDA and PLVB. There is no plaque. Left main is a large artery that gives rise to LAD and LCX arteries. LAD is a large vessel that gives rise to two diagonal arteries and has no plaque. LCX is a non-dominant artery that gives rise to two large OM1 branches. There is no plaque. Other findings: Normal pulmonary vein drainage into the left atrium. Normal let atrial appendage without a thrombus. Normal size of the pulmonary artery. IMPRESSION: 1. Coronary calcium score of 0. This was 0 percentile for age and sex matched control. 2. Normal coronary  origin with right dominance. 3. No evidence of CAD. 4. Bicuspid with non-separated right and left coronary leaflets. Mild leaflet calcifications. Maximum diameter at the sinus level 41 mm. 5. Aneurysmal dilatation of the ascending aorta with maximum diameter 45 mm, normal size of the aortic arch and descending thoracic aorta. No calcifications or dissection. 6. No other congenital anomalies were identified. 7. No thrombus in the left atrial appendage. 8. Normal size of the pulmonary artery. Tobias Alexander Electronically Signed   By: Tobias Alexander   On: 10/12/2017 16:48   Result Date: 10/12/2017 EXAM: OVER-READ INTERPRETATION  CT CHEST The following report is an over-read performed by radiologist Dr. [facility] on 10/12/2017. This over-read does not include interpretation of cardiac or coronary anatomy or pathology. The coronary calcium score/coronary CTA interpretation by the cardiologist is attached. COMPARISON:  Coronary calcium score 06/13/2017. FINDINGS: Mild aneurysmal dilatation of the ascending thoracic aorta which measures up to 4.5 cm in diameter. Previously noted 3 mm subpleural nodule in the left upper lobe is now sub mm in size. Previously noted subpleural right middle lobe nodule has slightly decreased in size measuring only 4 mm (axial image 43 of series 13), considered benign, likely a subpleural lymph node. Within the visualized portions of the thorax there are no larger more suspicious appearing pulmonary nodules or masses, there is no acute consolidative airspace disease, no pleural effusions, no pneumothorax and no lymphadenopathy. Visualized portions of the upper abdomen are unremarkable. There are no aggressive appearing lytic or blastic lesions noted in the visualized portions of the skeleton. IMPRESSION: 1. Mild aneurysmal dilatation of the ascending thoracic aorta measuring 4.5 cm in diameter. Ascending thoracic aortic aneurysm. Recommend semi-annual  imaging followup by CTA or MRA and referral to cardiothoracic surgery if not already obtained. This recommendation follows 2010 ACCF/AHA/AATS/ACR/ASA/SCA/SCAI/SIR/STS/SVM Guidelines for the Diagnosis and Management of Patients With Thoracic Aortic Disease. Circulation. 2010; 121: Y865-H846. 2. Previously noted tiny pulmonary nodules have decreased in size and are considered  benign requiring no future imaging followup. Electronically Signed: By: Trudie Reed M.D. On: 10/12/2017 09:37         CLINICAL DATA:  Cliffton Asters male with family history of heart disease.  EXAM: CT HEART FOR CALCIUM SCORING  TECHNIQUE: CT heart was performed on a 64 channel system using prospective ECG gating.  A non-contrast exam for calcium scoring was performed.  Note that this exam targets the heart and the chest was not imaged in its entirety.  COMPARISON:  None.  FINDINGS: Technical quality: Good.  CORONARY CALCIUM  Total Agatston Score: 0  MESA database percentile:  0  OTHER FINDINGS:  Cardiovascular: Aneurysm of the ascending thoracic aorta measuring up to 4.5 cm. Calcifications at the aortic root and aortic valve. No coronary artery calcifications. Normal caliber of the main pulmonary artery. Trace amount of pericardial fluid.  Mediastinum/Nodes: No lymph node enlargement in the visualized mediastinum. Visualized esophagus is unremarkable.  Lungs/Pleura: 5 mm nodule along the right minor fissure on sequence 4, image 10. No large pleural effusions. 4 mm pleural-based nodule in the left upper lobe on sequence 4, image 1.  Upper Abdomen: Negative  Musculoskeletal: No acute bone abnormality.  IMPRESSION: Coronary calcium score is 0.  Aneurysm of the ascending thoracic aorta measuring up to 4.5 cm. Ascending thoracic aortic aneurysm. Recommend semi-annual imaging followup by CTA or MRA and referral to cardiothoracic surgery if not already obtained. This recommendation  follows 2010 ACCF/AHA/AATS/ACR/ASA/SCA/SCAI/SIR/STS/SVM Guidelines for the Diagnosis and Management of Patients With Thoracic Aortic Disease. Circulation. 2010; 121: Z610-R604  Small indeterminate pulmonary nodules. No follow-up needed if patient is low-risk (and has no known or suspected primary neoplasm). Non-contrast chest CT can be considered in 12 months if patient is high-risk. This recommendation follows the consensus statement: Guidelines for Management of Incidental Pulmonary Nodules Detected on CT Images: From the Fleischner Society 2017; Radiology 2017; 284:228-243.   Electronically Signed   By: Richarda Overlie M.D.   On: 06/13/2017 14:09 I have independently reviewed the above radiologic studies.  Recent Lab Findings: Lab Results  Component Value Date   WBC 8.1 11/19/2010   HGB 14.5 11/19/2010   HCT 40.7 11/19/2010   PLT 302 11/19/2010   GLUCOSE 92 03/06/2009   NA 137 03/06/2009   K 3.8 03/06/2009   CL 104 03/06/2009   CREATININE 0.92 03/06/2009   BUN 13 03/06/2009   CO2 24 03/06/2009   Aortic Size Index=   4.5      /Body surface area is 2.16 meters squared. = 2.14 < 2.75 cm/m2      4% risk per year 2.75 to 4.25          8% risk per year > 4.25 cm/m2    20% risk per year     Assessment / Plan:    We will plan on CTA of the chest in 6 months, current size of the ascending aorta is at 4.5 cm and unchanged, in the setting of a bicuspid aortic valve but without aortic stenosis or insufficiency.  The patient does have a positive family history of sudden death in both his father and paternal grandfather      Patient was warned about not using Cipro and similar antibiotics. Recent studies have raised concern that fluoroquinolone antibiotics could be associated with an increased risk of aortic aneurysm Fluoroquinolones have non-antimicrobial properties that might jeopardise the integrity of the extracellular matrix of the vascular wall In a  propensity score  matched cohort study in [place], there  was a 66% increased rate of aortic aneurysm or dissection associated with oral fluoroquinolone use, compared with amoxicillin use, within a 60 day risk period from start of treatment   Delight Ovens MD      [address] [place] [place] [address] Office [phone]   Beeper [phone]  11/23/2018 1:33 PM

## 2018-11-30 DIAGNOSIS — L0231 Cutaneous abscess of buttock: Secondary | ICD-10-CM | POA: Diagnosis not present

## 2018-11-30 DIAGNOSIS — Z6831 Body mass index (BMI) 31.0-31.9, adult: Secondary | ICD-10-CM | POA: Diagnosis not present

## 2019-01-29 DIAGNOSIS — C4441 Basal cell carcinoma of skin of scalp and neck: Secondary | ICD-10-CM | POA: Diagnosis not present

## 2019-03-28 ENCOUNTER — Other Ambulatory Visit: Payer: Self-pay | Admitting: *Deleted

## 2019-03-28 DIAGNOSIS — I712 Thoracic aortic aneurysm, without rupture, unspecified: Secondary | ICD-10-CM

## 2019-06-20 ENCOUNTER — Ambulatory Visit
Admission: RE | Admit: 2019-06-20 | Discharge: 2019-06-20 | Disposition: A | Discharge status: Home / Self Care | Payer: BLUE CROSS/BLUE SHIELD | Source: Ambulatory Visit | Attending: Cardiothoracic Surgery | Admitting: Cardiothoracic Surgery

## 2019-06-20 ENCOUNTER — Other Ambulatory Visit: Payer: Self-pay

## 2019-06-20 DIAGNOSIS — I712 Thoracic aortic aneurysm, without rupture, unspecified: Secondary | ICD-10-CM

## 2019-06-20 MED ORDER — IOPAMIDOL (ISOVUE-370) INJECTION 76%
75.0000 mL | Freq: Once | INTRAVENOUS | Status: AC | PRN
  Administered 2019-06-20: 75 mL via INTRAVENOUS

## 2019-06-20 NOTE — Progress Notes (Signed)
[address]       [place] [id number]             [phone]                    Adrian PrestoAlan Murphy [facility] Medical Record #[id number] Date of Birth: 04/12/1966  Referring: Martha ClanShaw, William, MD Primary Care: Martha ClanShaw, William, MD Cardiology: Dr Jens Somrenshaw  Chief Complaint:    Chief Complaint  Patient presents with   Thoracic Aortic Aneurysm    6 month f/u with Chest CTA    History of Present Illness:  Patient is a 53 year old male followed in the office for dilated ascending aorta with known bicuspid aortic valve    Patient was initially seen in 2018 with a limited CT done for cardiac calcium scoring t His calcium score was 0 but he was noted to have a 4.5 cm ascending aortic aneurysm.  An echocardiogram suggested a bicuspid valve.  A full CTA of the chest  was done for further evaluation of the valve and thoracic aorta.    Patient's family history is significant for his father who died at age 51 about 2 hours after sudden collapse while working at his desk.  He was presumed he had a myocardial infarction but no definite diagnosis was made.  His paternal grandfather died in his early 11060s of sudden collapse while walking down the street.  Again no definite diagnosis was made.  He has 2 sisters who are in their 7760s who have been evaluated for dilated aorta,   Current Activity/ Functional Status:  Patient is independent with mobility/ambulation, transfers, ADL's, IADL's.   Zubrod Score: At the time of surgery this patients most appropriate activity status/level should be described as: [x]     0    Normal activity, no symptoms []     1    Restricted in physical strenuous activity but ambulatory, able to do out light work []     2    Ambulatory and capable of self care, unable to do work activities, up and about               >50 % of waking hours                              []     3    Only limited self care, in bed greater than 50% of waking hours []     4    Completely  disabled, no self care, confined to bed or chair []     5    Moribund   Past Medical History:  Diagnosis Date   Allergy    Thoracic aortic aneurysm (HCC)     Past Surgical History:  Procedure Laterality Date   CERVICAL DISC SURGERY     x3   SIGMOIDOSCOPY      Family History  Problem Relation Age of Onset   Alzheimer's disease Mother    CAD Father    Stroke Paternal Grandmother    Heart disease Paternal Grandfather    Ovarian cancer Maternal Grandmother    Colon cancer Neg Hx    Esophageal cancer Neg Hx    Stomach cancer Neg Hx    Rectal cancer Neg Hx     See above     Social History   Social History   Marital status: Married    Spouse name: N/A   Number of  children: 4   Years of education: N/A   Occupational History   Chief of Staff at [employer]   Social History Main Topics   Smoking status: Never Smoker   Smokeless tobacco: Never Used   Alcohol use No   Drug use: Unknown   Sexual activity: Not on file     Social History   Tobacco Use  Smoking Status Never Smoker  Smokeless Tobacco Never Used    Social History   Substance and Sexual Activity  Alcohol Use No     Allergies  Allergen Reactions   Pollen Extract Other (See Comments)    Runny nose, Itchy Eyes   Quinolones     Patient was warned about not using Cipro and similar antibiotics. Recent studies have raised concern that fluoroquinolone antibiotics could be associated with an increased risk of aortic aneurysm Fluoroquinolones have non-antimicrobial properties that might jeopardise the integrity of the extracellular matrix of the vascular wall In a  propensity score matched cohort study in [place], there was a 66% increased rate of aortic aneurysm or dissection associated with oral fluoroquinolone use, compared wit    Current Outpatient Medications  Medication Sig Dispense Refill   Cetirizine HCl (ZYRTEC ALLERGY) 10 MG CAPS Take 1 tablet by mouth daily.       fluticasone (FLONASE) 50 MCG/ACT nasal spray Place 2 sprays into both nostrils daily.     sildenafil (REVATIO) 20 MG tablet TAKE 1-5 TABLETS BY MOUTH AS NEEDED  6   triprolidine-pseudoephedrine (APRODINE) 2.5-60 MG TABS tablet Take 1 tablet by mouth every 6 (six) hours as needed for allergies.     No current facility-administered medications for this visit.     Pertinent items are noted in HPI.    Review of Systems:     Cardiac Review of Systems: Y or N  Chest Pain [ n  Resting SOB [nExertional SOB  [n ]  Pollyann Kennedy Milo.Brash ]   Pedal Edema [ n  ]    Palpitations [ n] Syncope  [n]   Presyncope [n ]  General Review of Systems: [Y] = yes [  ]=no Constitional: recent weight change [  ]; anorexia [  ]; fatigue [  ]; nausea [  ]; night sweats [  ]; fever [  ]; or chills [  ];                                                                                                                                          Dental: poor dentition[  ]; Last Dentist visit: Regular dental care  Eye : blurred vision [  ]; diplopia [   ]; vision changes [  ];  Amaurosis fugax[  ]; Resp: cough [  ];  wheezing[  ];  hemoptysis[  ]; shortness of breath[  ]; paroxysmal nocturnal dyspnea[  ]; dyspnea on exertion[  ]; or orthopnea[  ];  GI:  gallstones[  ], vomiting[  ];  dysphagia[  ]; melena[  ];  hematochezia [  ]; heartburn[  ];   Hx of  Colonoscopy[  ]; GU: kidney stones [  ]; hematuria[  ];   dysuria [  ];  nocturia[  ];  history of     obstruction [  ];                 Skin: rash, swelling[  ];, hair loss[  ];  peripheral edema[  ];  or itching[  ]; Musculosketetal: myalgias[  ];  joint swelling[  ];  joint erythema[  ];  joint pain[  ];  back pain[  ];  Heme/Lymph: bruising[  ];  bleeding[  ];  anemia[  ];  Neuro: TIA[  ];  headaches[  ];  stroke[  ];  vertigo[  ];  seizures[  ];   paresthesias[  ];  difficulty walking[  ];  Psych:depression[  ]; anxiety[  ];  Endocrine: diabetes[  ];  thyroid dysfunction[   ];  Immunizations: Flu Cove.Etienne[y  ]; Pneumococcal[y  ];   Physical Exam: BP 123/84    Pulse 92    Temp 98.1 F (36.7 C) (Skin)    Resp 20    Ht 5\' 9"  (1.753 m)    Wt 212 lb (96.2 kg)    SpO2 97% Comment: RA   BMI 31.31 kg/m   PHYSICAL EXAMINATION: General appearance: alert, cooperative and no distress Neck: no adenopathy, no carotid bruit, no JVD, supple, symmetrical, trachea midline and thyroid not enlarged, symmetric, no tenderness/mass/nodules Resp: clear to auscultation bilaterally Cardio: regular rate and rhythm GI: soft, non-tender; bowel sounds normal; no masses,  no organomegaly Extremities: extremities normal, atraumatic, no cyanosis or edema and Homans sign is negative, no sign of DVT Neurologic: Grossly normal  Patient has no stigmata of Marfan's or other congenital aortopathy.   Diagnostic Studies & Laboratory data:    ECHO:10/2018 Echocardiography  Patient:    Adrian PrestoWoodlief, Adrian Murphy MR #:       [id number] Study Date: 11/17/2018 Gender:     M Age:        52 Height:     175.3 cm Weight:     95.3 kg BSA:        2.18 m^2 Pt. Status: Room:   ATTENDING    Sheliah PlaneEdward Icela Glymph MD  ORDERING     Sheliah PlaneEdward Colum Colt MD  REFERRING    Sheliah PlaneEdward Keyion Knack MD  PERFORMING   [facility], Outpatient  SONOGRAPHER  [facility] [place]  cc:  ------------------------------------------------------------------- LV EF: 55% -   60%  ------------------------------------------------------------------- Indications:      Aneurysm - thoracic 441.2.  ------------------------------------------------------------------- History:   PMH:  Bicuspid aortic valve.  ------------------------------------------------------------------- Study Conclusions  - Left ventricle: The cavity size was normal. Wall thickness was   increased in a pattern of mild LVH. Systolic function was normal.   The estimated ejection fraction was in the range of 55% to 60%.   Wall motion was normal; there were no regional wall motion    abnormalities. Left ventricular diastolic function parameters   were normal. - Aortic valve: Bicuspid valve. No significant stenosis. Mean   gradient (S): 11 mm Hg. Peak gradient (S): 19 mm Hg. Valve area   (VTI): 2.95 cm^2. Valve area (Vmax): 2.63 cm^2. Valve area   (Vmean): 2.62 cm^2. - Aorta: Ascending aortic diameter: 45 mm (S). - Ascending aorta: The ascending aorta was moderately dilated. - Left atrium: The  atrium was normal in size. - Tricuspid valve: There was trivial regurgitation. - Pulmonary arteries: PA peak pressure: 23 mm Hg (S). - Inferior vena cava: The vessel was normal in size. The   respirophasic diameter changes were in the normal range (>= 50%),   consistent with normal central venous pressure.  Impressions:  - Compared to a prior study in 2018, there are few changes. The   ascending aorta may be slightly larger at 4.5 cm.  ------------------------------------------------------------------- Study data:  Comparison was made to the study of 07/26/2017.  Study status:  Routine.  Procedure:  The patient reported no pain pre or post test. Transthoracic echocardiography. Image quality was adequate.  Study completion:  There were no complications. Echocardiography.  M-mode, complete 2D, spectral Doppler, and color Doppler.  Birthdate:  Patient birthdate: 1965/12/31.  Age:  Patient is 53 yr old.  Sex:  Gender: male.    BMI: 31 kg/m^2.  Blood pressure:     106/70  Patient status:  Outpatient.  Study date: Study date: 11/17/2018. Study time: 08:00 AM.  Location:  Echo laboratory.  -------------------------------------------------------------------  ------------------------------------------------------------------- Left ventricle:  The cavity size was normal. Wall thickness was increased in a pattern of mild LVH. Systolic function was normal. The estimated ejection fraction was in the range of 55% to 60%. Wall motion was normal; there were no regional wall  motion abnormalities. The transmitral flow pattern was normal. The deceleration time of the early transmitral flow velocity was normal. The pulmonary vein flow pattern was normal. The tissue Doppler parameters were normal. Left ventricular diastolic function parameters were normal.  ------------------------------------------------------------------- Aortic valve:  Bicuspid valve. No significant stenosis.  Doppler:   VTI ratio of LVOT to aortic valve: 0.65. Valve area (VTI): 2.95 cm^2. Indexed valve area (VTI): 1.35 cm^2/m^2. Peak velocity ratio of LVOT to aortic valve: 0.58. Valve area (Vmax): 2.63 cm^2. Indexed valve area (Vmax): 1.21 cm^2/m^2. Mean velocity ratio of LVOT to aortic valve: 0.58. Valve area (Vmean): 2.62 cm^2. Indexed valve area (Vmean): 1.2 cm^2/m^2.    Mean gradient (S): 11 mm Hg. Peak gradient (S): 19 mm Hg.  ------------------------------------------------------------------- Aorta:  Ascending aorta: The ascending aorta was moderately dilated.  ------------------------------------------------------------------- Mitral valve:   Structurally normal valve.   Leaflet separation was normal.  Doppler:  Transvalvular velocity was within the normal range. There was no evidence for stenosis. There was no regurgitation.    Valve area by pressure half-time: 3.33 cm^2. Indexed valve area by pressure half-time: 1.53 cm^2/m^2.    Peak gradient (Murphy): 3 mm Hg.  ------------------------------------------------------------------- Left atrium:  The atrium was normal in size.  ------------------------------------------------------------------- Atrial septum:  No defect or patent foramen ovale was identified.   ------------------------------------------------------------------- Right ventricle:  The cavity size was normal. Wall thickness was normal. Systolic function was normal.  ------------------------------------------------------------------- Pulmonic valve:    The  valve appears to be grossly normal. Doppler:  There was no significant regurgitation.  ------------------------------------------------------------------- Tricuspid valve:   Doppler:  There was trivial regurgitation.  ------------------------------------------------------------------- Pulmonary artery:   The main pulmonary artery was normal-sized.  ------------------------------------------------------------------- Right atrium:  The atrium was normal in size.  ------------------------------------------------------------------- Pericardium:  There was no pericardial effusion.  ------------------------------------------------------------------- Systemic veins: Inferior vena cava: The vessel was normal in size. The respirophasic diameter changes were in the normal range (>= 50%), consistent with normal central venous pressure.  ------------------------------------------------------------------- Measurements   Left ventricle  Value          Reference  LV ID, ED, PLAX chordal                  52    mm       43 - 52  LV ID, ES, PLAX chordal          (H)     39    mm       23 - 38  LV fx shortening, PLAX chordal   (L)     25    %        >=29  LV PW thickness, ED                      11    mm       ----------  IVS/LV PW ratio, ED                      1              <=1.3  Stroke volume, 2D                        140   ml       ----------  Stroke volume/bsa, 2D                    64    ml/m^2   ----------  LV ejection fraction, 1-p A4C            56    %        ----------  LV end-diastolic volume, 2-p             83    ml       ----------  LV end-systolic volume, 2-p              35    ml       ----------  LV ejection fraction, 2-p                58    %        ----------  Stroke volume, 2-p                       48    ml       ----------  LV end-diastolic volume/bsa, 2-p         38    ml/m^2   ----------  LV end-systolic volume/bsa, 2-p          16     ml/m^2   ----------  Stroke volume/bsa, 2-p                   22.1  ml/m^2   ----------  LV e&', lateral                           11    cm/s     ----------  LV E/e&', lateral                         8.46           ----------  LV e&', medial                            9.9  cm/s     ----------  LV E/e&', medial                          9.4            ----------  LV e&', average                           10.45 cm/s     ----------  LV E/e&', average                         8.91           ----------    Ventricular septum                       Value          Reference  IVS thickness, ED                        11    mm       ----------    LVOT                                     Value          Reference  LVOT ID, S                               24    mm       ----------  LVOT area                                4.52  cm^2     ----------  LVOT peak velocity, S                    127   cm/s     ----------  LVOT mean velocity, S                    92    cm/s     ----------  LVOT VTI, S                              31    cm       ----------  LVOT peak gradient, S                    6     mm Hg    ----------    Aortic valve                             Value          Reference  Aortic valve peak velocity, S            218   cm/s     ----------  Aortic valve mean velocity, S            159   cm/s     ----------  Aortic valve VTI, S  47.5  cm       ----------  Aortic mean gradient, S                  11    mm Hg    ----------  Aortic peak gradient, S                  19    mm Hg    ----------  VTI ratio, LVOT/AV                       0.65           ----------  Aortic valve area, VTI                   2.95  cm^2     ----------  Aortic valve area/bsa, VTI               1.35  cm^2/m^2 ----------  Velocity ratio, peak, LVOT/AV            0.58           ----------  Aortic valve area, peak velocity         2.63  cm^2     ----------  Aortic valve area/bsa, peak              1.21   cm^2/m^2 ----------  velocity  Velocity ratio, mean, LVOT/AV            0.58           ----------  Aortic valve area, mean velocity         2.62  cm^2     ----------  Aortic valve area/bsa, mean              1.2   cm^2/m^2 ----------  velocity    Aorta                                    Value          Reference  Aortic root ID, ED                       36    mm       ----------  Ascending aorta ID, A-P, S               45    mm       ----------    Left atrium                              Value          Reference  LA ID, A-P, ES                           27    mm       ----------  LA ID/bsa, A-P                           1.24  cm/m^2   <=2.2  LA volume, S                             35.3  ml       ----------  LA volume/bsa, S                         16.2  ml/m^2   ----------  LA volume, ES, 1-p A4C                   24.7  ml       ----------  LA volume/bsa, ES, 1-p A4C               11.3  ml/m^2   ----------  LA volume, ES, 1-p A2C                   44.8  ml       ----------  LA volume/bsa, ES, 1-p A2C               20.5  ml/m^2   ----------    Mitral valve                             Value          Reference  Mitral E-wave peak velocity              93.1  cm/s     ----------  Mitral A-wave peak velocity              58.7  cm/s     ----------  Mitral deceleration time                 225   ms       150 - 230  Mitral pressure half-time                66    ms       ----------  Mitral peak gradient, Murphy                  3     mm Hg    ----------  Mitral E/A ratio, peak                   1.6            ----------  Mitral valve area, PHT, DP               3.33  cm^2     ----------  Mitral valve area/bsa, PHT, DP           1.53  cm^2/m^2 ----------    Pulmonary arteries                       Value          Reference  PA pressure, S, DP                       23    mm Hg    <=30    Tricuspid valve                          Value          Reference  Tricuspid regurg peak velocity           222    cm/s     ----------  Tricuspid peak RV-RA gradient            20    mm Hg    ----------  Right atrium                             Value          Reference  RA ID, S-I, ES, A4C                      47    mm       34 - 49  RA area, ES, A4C                         15    cm^2     8.3 - 19.5  RA volume, ES, A/L                       40    ml       ----------  RA volume/bsa, ES, A/L                   18.3  ml/m^2   ----------    Systemic veins                           Value          Reference  Estimated CVP                            3     mm Hg    ----------    Right ventricle                          Value          Reference  TAPSE                                    28.7  mm       ----------  RV pressure, S, DP                       23    mm Hg    <=30  RV s&', lateral, S                        13.6  cm/s     ----------  Legend: (L)  and  (H)  mark values outside specified reference range.  ------------------------------------------------------------------- Prepared and Electronically Authenticated by  Zoila Shutter MD 2019-11-29T12:58:04 Echocardiography    Recent Radiology Findings:  Ct Angio Chest Aorta W &/or Wo Contrast  Result Date: 06/20/2019 CLINICAL DATA:  Thoracic aortic prominence EXAM: CT ANGIOGRAPHY CHEST WITH CONTRAST TECHNIQUE: Multidetector CT imaging of the chest was performed using the standard protocol during bolus administration of intravenous contrast. Multiplanar CT image reconstructions and MIPs were obtained to evaluate the vascular anatomy. CONTRAST:  75mL ISOVUE-370 IOPAMIDOL (ISOVUE-370) INJECTION 76% COMPARISON:  October 12, 2017 coronary CT FINDINGS: Cardiovascular: The ascending thoracic aorta has a measured transverse diameter of 4.6 x 4.6 cm. At the sinotubular junction measured on the coronal sequence, the measured transverse diameter is 3.9 cm. At the arch, the measured diameter is 2.9 cm. The measured diameter of the descending thoracic aorta at  the main pulmonary outflow tract measures 2.6 x 2.5  cm. There is no thoracic aortic dissection. Visualized great vessels appear unremarkable. There is no pericardial effusion or pericardial thickening. There is no demonstrable pulmonary embolus. Mediastinum/Nodes: There are scattered subcentimeter nodular opacities in the thyroid. No dominant thyroid mass evident. Thyroid otherwise appears unremarkable. There is no appreciable thoracic adenopathy. No esophageal lesions are evident. Lungs/Pleura: There is mild scarring and atelectatic change in the medial aspect of the posterior segment of the right lower lobe. There is no frank airspace consolidation or edema. No pleural effusion or pleural thickening evident. Upper Abdomen: There is hepatic steatosis. Visualized upper abdominal structures otherwise appear unremarkable. Musculoskeletal: There are no blastic or lytic bone lesions. There is postoperative change in the lower cervical spine region. No chest wall lesions are evident. Review of the MIP images confirms the above findings. IMPRESSION: 1. Ascending thoracic aorta has a measured transverse diameter of 4.6 x 4.6 cm, essentially stable compared to prior study. No thoracic aortic dissection. Ascending thoracic aortic aneurysm. Recommend semi-annual imaging followup by CTA or MRA and referral to cardiothoracic surgery if not already obtained. This recommendation follows 2010 ACCF/AHA/AATS/ACR/ASA/SCA/SCAI/SIR/STS/SVM Guidelines for the Diagnosis and Management of Patients With Thoracic Aortic Disease. Circulation. 2010; 121: Z610-R604. Aortic aneurysm NOS (ICD10-I71.9). 2.  No demonstrable pulmonary embolus. 3. Mild atelectatic change in scarring medial right base. No edema or consolidation. 4.  No evident thoracic adenopathy. 5.  Hepatic steatosis. Electronically Signed   By: Bretta Bang III M.Murphy.   On: 06/20/2019 11:21   I have independently reviewed the above radiology studies  and reviewed the findings  with the patient.   Mr Angiogram Chest W Wo Contrast  Result Date: 11/08/2018 CLINICAL DATA:  Follow-up thoracic aortic aneurysm. History of bicuspid aortic valve EXAM: MRA CHEST WITH CONTRAST TECHNIQUE: Angiographic images of the chest were obtained using MRA technique with intravenous contrast. CONTRAST:  19 mL MULTIHANCE GADOBENATE DIMEGLUMINE 529 MG/ML IV SOLN COMPARISON:  05/01/2018; 10/12/2017; 06/13/2017 FINDINGS: Vascular Findings: Stable uncomplicated fusiform aneurysmal dilatation of the ascending thoracic aorta with measurements as follows. The thoracic aorta tapers to a normal caliber at the level of the aortic arch. Note is made of a small ductus diverticulum. The descending thoracic aorta is of normal caliber. Conventional configuration of the aortic arch. The branch vessels of the aortic arch appear widely patent throughout their imaged courses. Borderline cardiomegaly.  No pericardial effusion. Although this examination was not tailored for the evaluation the pulmonary arteries, there are no discrete filling defects within the central pulmonary arterial tree to suggest central pulmonary embolism. Normal caliber of the main pulmonary artery. ------------------------------------------------------------- Thoracic aortic measurements: Sinotubular junction 37 mm as measured in greatest oblique sagittal dimension, though evaluation degraded secondary to pulsation artifact. Proximal ascending aorta 44 mm as measured in greatest oblique axial dimension at the level of the main pulmonary artery (image 53, series 16) and approximately 45 mm in greatest oblique short axis sagittal diameter (sagittal image 57, series 13), grossly unchanged compared to the 09/2017 examination, previously, 44 and 45 mm in diameter respectively. Aortic arch aorta 26 mm as measured in greatest oblique sagittal dimension. Proximal descending thoracic aorta 21 mm as measured in greatest oblique axial dimension at the level of the  main pulmonary artery. Distal descending thoracic aorta 21 mm as measured in greatest oblique axial dimension at the level of the diaphragmatic hiatus. Review of the MIP images confirms the above findings. ------------------------------------------------------------- Non-Vascular Findings: Mediastinum/Lymph Nodes: No bulky mediastinal, hilar or axillary lymphadenopathy. Lungs/Pleura: Ill-defined slightly nodular/linear heterogeneous opacities  are seen within the right lower lobe measuring approximately 1.0 x 0.6 cm (image 60, series 16). Minimal amount of adjacent presumed subsegmental atelectasis. No discrete focal airspace opacities. No pleural effusion. Upper abdomen: Limited evaluation of the upper abdomen is normal. Musculoskeletal: No definitive acute or aggressive osseous abnormalities. IMPRESSION: 1. Stable uncomplicated fusiform aneurysmal dilatation of the ascending thoracic aorta measuring 45 mm in maximal diameter, unchanged compared to the 09/2017 examination. 2. Aortic aneurysm NOS (ICD10-I71.9). 3. Borderline cardiomegaly. 4. Ill-defined right lower lobe nodular/linear heterogeneous opacities, potentially atelectasis though incompletely evaluated on the present examination. Correlation for infectious symptoms is advised. Electronically Signed   By: Simonne Come M.Murphy.   On: 11/08/2018 17:31     Mr Angiogram Chest W Wo Contrast  Result Date: 05/02/2018 CLINICAL DATA:  53 year old male with bicuspid aortic valve and thoracic aortic aneurysm. EXAM: MRA CHEST WITH OR WITHOUT CONTRAST TECHNIQUE: Angiographic images of the chest were obtained using MRA technique 19 intravenous contrast. CONTRAST:  19mL MULTIHANCE GADOBENATE DIMEGLUMINE 529 MG/ML IV SOLN COMPARISON:  None. FINDINGS: VASCULAR Aorta: Unchanged appearance of the course, caliber, and contour of the thoracic aorta. Diameter measured today in the ascending aorta is approximately 4.5cm. No evidence of dissection flap. No peri-aortic fluid. No  dilation of the descending thoracic aorta. Three-vessel arch with patent branch vessels. Proximal cervical arteries demonstrate maintained flow signal. Heart: Heart size unremarkable. No peri-cardial effusion/thickening. Pulmonary Arteries: Unremarkable. NON-VASCULAR Unremarkable lungs. Unremarkable musculoskeletal elements. Surgical changes of the cervical region. Unremarkable upper abdomen. IMPRESSION: Unchanged size of the ascending aorta in this patient with known bicuspid aortic valve. Greatest diameter 4.5 cm. Aortic aneurysm NOS (ICD10-I71.9). Recommend semi-annual imaging followup by CTA or MRA and referral to cardiothoracic surgery if not already obtained. This recommendation follows 2010 ACCF/AHA/AATS/ACR/ASA/SCA/SCAI/SIR/STS/SVM Guidelines for the Diagnosis and Management of Patients With Thoracic Aortic Disease. Circulation. 2010; 121: Z610-R604 Electronically Signed   By: Gilmer Mor Murphy.O.   On: 05/02/2018 07:54    Ct Coronary Morph W/cta Cor W/score W/ca W/cm &/or Wo/cm  Addendum Date: 10/12/2017   ADDENDUM REPORT: 10/12/2017 16:48 CLINICAL DATA:  53 year old male with bicuspid aortic valve and ascending aortic aneurysm. EXAM: Cardiac/Coronary  CT TECHNIQUE: The patient was scanned on a [employer]. FINDINGS: A 120 kV prospective scan was triggered in the descending thoracic aorta at 111 HU's. Axial non-contrast 3 mm slices were carried out through the heart. The data set was analyzed on a dedicated work station and scored using the Agatson method. Gantry rotation speed was 250 msecs and collimation was .6 mm. No beta blockade and 0.8 mg of sl NTG was given. The 3D data set was reconstructed in 5% intervals of the 67-82 % of the R-R cycle. Diastolic phases were analyzed on a dedicated work station using MPR, MIP and VRT modes. The patient received 80 cc of contrast. Aortic Valve: Bicuspid with non-separated right and left coronary leaflets. Mild leaflet calcifications. Maximum  diameter at the sinus level 41 mm. Aorta: Aneurysmal dilatation of the ascending aorta with maximum diameter 45 x 45 mm, normal size of the aortic arch and descending thoracic aorta. No calcifications or dissection. Sinotubular junction:  35 x 35 mm Coronary Arteries:  Normal coronary origin.  Right dominance. RCA is a large dominant artery that gives rise to PDA and PLVB. There is no plaque. Left main is a large artery that gives rise to LAD and LCX arteries. LAD is a large vessel that gives rise to two diagonal arteries and  has no plaque. LCX is a non-dominant artery that gives rise to two large OM1 branches. There is no plaque. Other findings: Normal pulmonary vein drainage into the left atrium. Normal let atrial appendage without a thrombus. Normal size of the pulmonary artery. IMPRESSION: 1. Coronary calcium score of 0. This was 0 percentile for age and sex matched control. 2. Normal coronary origin with right dominance. 3. No evidence of CAD. 4. Bicuspid with non-separated right and left coronary leaflets. Mild leaflet calcifications. Maximum diameter at the sinus level 41 mm. 5. Aneurysmal dilatation of the ascending aorta with maximum diameter 45 mm, normal size of the aortic arch and descending thoracic aorta. No calcifications or dissection. 6. No other congenital anomalies were identified. 7. No thrombus in the left atrial appendage. 8. Normal size of the pulmonary artery. Tobias Alexander Electronically Signed   By: Tobias Alexander   On: 10/12/2017 16:48   Result Date: 10/12/2017 EXAM: OVER-READ INTERPRETATION  CT CHEST The following report is an over-read performed by radiologist Dr. Royal Piedra [facility] on 10/12/2017. This over-read does not include interpretation of cardiac or coronary anatomy or pathology. The coronary calcium score/coronary CTA interpretation by the cardiologist is attached. COMPARISON:  Coronary calcium score 06/13/2017. FINDINGS: Mild aneurysmal dilatation  of the ascending thoracic aorta which measures up to 4.5 cm in diameter. Previously noted 3 mm subpleural nodule in the left upper lobe is now sub mm in size. Previously noted subpleural right middle lobe nodule has slightly decreased in size measuring only 4 mm (axial image 43 of series 13), considered benign, likely a subpleural lymph node. Within the visualized portions of the thorax there are no larger more suspicious appearing pulmonary nodules or masses, there is no acute consolidative airspace disease, no pleural effusions, no pneumothorax and no lymphadenopathy. Visualized portions of the upper abdomen are unremarkable. There are no aggressive appearing lytic or blastic lesions noted in the visualized portions of the skeleton. IMPRESSION: 1. Mild aneurysmal dilatation of the ascending thoracic aorta measuring 4.5 cm in diameter. Ascending thoracic aortic aneurysm. Recommend semi-annual imaging followup by CTA or MRA and referral to cardiothoracic surgery if not already obtained. This recommendation follows 2010 ACCF/AHA/AATS/ACR/ASA/SCA/SCAI/SIR/STS/SVM Guidelines for the Diagnosis and Management of Patients With Thoracic Aortic Disease. Circulation. 2010; 121: O536-U440. 2. Previously noted tiny pulmonary nodules have decreased in size and are considered benign requiring no future imaging followup. Electronically Signed: By: Trudie Reed M.Murphy. On: 10/12/2017 09:37         CLINICAL DATA:  Cliffton Asters male with family history of heart disease.  EXAM: CT HEART FOR CALCIUM SCORING  TECHNIQUE: CT heart was performed on a 64 channel system using prospective ECG gating.  A non-contrast exam for calcium scoring was performed.  Note that this exam targets the heart and the chest was not imaged in its entirety.  COMPARISON:  None.  FINDINGS: Technical quality: Good.  CORONARY CALCIUM  Total Agatston Score: 0  MESA database percentile:  0  OTHER FINDINGS:  Cardiovascular:  Aneurysm of the ascending thoracic aorta measuring up to 4.5 cm. Calcifications at the aortic root and aortic valve. No coronary artery calcifications. Normal caliber of the main pulmonary artery. Trace amount of pericardial fluid.  Mediastinum/Nodes: No lymph node enlargement in the visualized mediastinum. Visualized esophagus is unremarkable.  Lungs/Pleura: 5 mm nodule along the right minor fissure on sequence 4, image 10. No large pleural effusions. 4 mm pleural-based nodule in the left upper lobe on sequence 4, image 1.  Upper Abdomen: Negative  Musculoskeletal: No acute bone abnormality.  IMPRESSION: Coronary calcium score is 0.  Aneurysm of the ascending thoracic aorta measuring up to 4.5 cm. Ascending thoracic aortic aneurysm. Recommend semi-annual imaging followup by CTA or MRA and referral to cardiothoracic surgery if not already obtained. This recommendation follows 2010 ACCF/AHA/AATS/ACR/ASA/SCA/SCAI/SIR/STS/SVM Guidelines for the Diagnosis and Management of Patients With Thoracic Aortic Disease. Circulation. 2010; 121: W098-J191  Small indeterminate pulmonary nodules. No follow-up needed if patient is low-risk (and has no known or suspected primary neoplasm). Non-contrast chest CT can be considered in 12 months if patient is high-risk. This recommendation follows the consensus statement: Guidelines for Management of Incidental Pulmonary Nodules Detected on CT Images: From the Fleischner Society 2017; Radiology 2017; 284:228-243.   Electronically Signed   By: Richarda Overlie M.Murphy.   On: 06/13/2017 14:09 I have independently reviewed the above radiologic studies.  Recent Lab Findings: Lab Results  Component Value Date   WBC 8.1 11/19/2010   HGB 14.5 11/19/2010   HCT 40.7 11/19/2010   PLT 302 11/19/2010   GLUCOSE 92 03/06/2009   NA 137 03/06/2009   K 3.8 03/06/2009   CL 104 03/06/2009   CREATININE 0.92 03/06/2009   BUN 13 03/06/2009   CO2 24  03/06/2009   Aortic Size Index=   4.6     /Body surface area is 2.16 meters squared. = 2.14 < 2.75 cm/m2      4% risk per year 2.75 to 4.25          8% risk per year > 4.25 cm/m2    20% risk per year    Assessment / Plan:   #1 mild dilatation of the ascending aorta in patient with known bicuspid aortic valve, without valve malfunction #2+ family history of sudden death in his father and paternal grandfather  I reviewed with the patient the current CT findings, at this point there is no indication for surgical intervention but the patient does need to continue with follow-up imaging to measure the ascending aorta.  He is aware to avoid Valsalva and strenuous , lifting and to avoid quinolone antibiotics.  We will plan to see the patient back in 8 months with a follow-up CTA of the chest     Delight Ovens MD      [address] [place] [address] Office [phone]   Beeper [phone]  06/21/2019 12:46 PM

## 2019-06-21 ENCOUNTER — Other Ambulatory Visit: Payer: Self-pay

## 2019-06-21 ENCOUNTER — Ambulatory Visit: Payer: BC Managed Care – PPO | Admitting: Cardiothoracic Surgery

## 2019-06-21 VITALS — BP 123/84 | HR 92 | Temp 98.1°F | Resp 20 | Ht 69.0 in | Wt 212.0 lb

## 2019-06-21 DIAGNOSIS — I712 Thoracic aortic aneurysm, without rupture, unspecified: Secondary | ICD-10-CM

## 2019-06-21 DIAGNOSIS — Z8249 Family history of ischemic heart disease and other diseases of the circulatory system: Secondary | ICD-10-CM | POA: Diagnosis not present

## 2019-06-21 DIAGNOSIS — Q231 Congenital insufficiency of aortic valve: Secondary | ICD-10-CM | POA: Diagnosis not present

## 2019-07-03 DIAGNOSIS — Z0000 Encounter for general adult medical examination without abnormal findings: Secondary | ICD-10-CM | POA: Diagnosis not present

## 2019-07-03 DIAGNOSIS — R7301 Impaired fasting glucose: Secondary | ICD-10-CM | POA: Diagnosis not present

## 2019-07-03 DIAGNOSIS — Z125 Encounter for screening for malignant neoplasm of prostate: Secondary | ICD-10-CM | POA: Diagnosis not present

## 2019-07-10 DIAGNOSIS — E669 Obesity, unspecified: Secondary | ICD-10-CM | POA: Diagnosis not present

## 2019-07-10 DIAGNOSIS — I712 Thoracic aortic aneurysm, without rupture: Secondary | ICD-10-CM | POA: Diagnosis not present

## 2019-07-10 DIAGNOSIS — M25552 Pain in left hip: Secondary | ICD-10-CM | POA: Diagnosis not present

## 2019-07-10 DIAGNOSIS — R7301 Impaired fasting glucose: Secondary | ICD-10-CM | POA: Diagnosis not present

## 2019-07-10 DIAGNOSIS — Z0000 Encounter for general adult medical examination without abnormal findings: Secondary | ICD-10-CM | POA: Diagnosis not present

## 2019-07-10 DIAGNOSIS — Q231 Congenital insufficiency of aortic valve: Secondary | ICD-10-CM | POA: Diagnosis not present

## 2019-09-08 DIAGNOSIS — Z23 Encounter for immunization: Secondary | ICD-10-CM | POA: Diagnosis not present

## 2020-01-30 ENCOUNTER — Other Ambulatory Visit: Payer: Self-pay | Admitting: Cardiothoracic Surgery

## 2020-01-30 DIAGNOSIS — I712 Thoracic aortic aneurysm, without rupture, unspecified: Secondary | ICD-10-CM

## 2020-02-26 ENCOUNTER — Ambulatory Visit: Payer: Self-pay | Admitting: *Deleted

## 2020-02-26 ENCOUNTER — Telehealth: Payer: Self-pay | Admitting: Medical

## 2020-02-26 ENCOUNTER — Other Ambulatory Visit: Payer: Self-pay

## 2020-02-26 DIAGNOSIS — Z20822 Contact with and (suspected) exposure to covid-19: Secondary | ICD-10-CM

## 2020-02-26 LAB — POC COVID19 BINAXNOW: SARS Coronavirus 2 Ag: NEGATIVE

## 2020-02-26 NOTE — Progress Notes (Signed)
54 yo male in non acute distresss.   Sunday symptoms of not feeling well/dizziness started, they  resolved in  10 minutes.  Woke up not feeling well, little dizzy and nauseous occurred again this morning, persisted through the morning, stomach upset feeling.  Denies fever or chills  Cough SOB or Chest pian. Or headache or loss of taste or smell no sore throat. Mild nasal congestion ( patient states he does have seasonal allergies).   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 Patient was    masked and distancing 6 feet apart.and not with him 15 minutes or  Longer.   Now feels better but not 100%, takes Zyrtec  For allergies, but did not take it  this am   Patient to isolate till he gets PCR  Results. Patient verbalizes understanding and has no questions at discharge.  . If negative he is to isolate for  14 days. If positive  he is to isolated since the on set of symptoms. Information about isolation and Covid-19 information were sent to the patient's MyChart.

## 2020-02-27 ENCOUNTER — Encounter: Payer: Self-pay | Admitting: Medical

## 2020-02-28 LAB — NOVEL CORONAVIRUS, NAA: SARS-CoV-2, NAA: NOT DETECTED

## 2020-03-06 ENCOUNTER — Ambulatory Visit: Payer: BC Managed Care – PPO | Admitting: Cardiothoracic Surgery

## 2020-03-06 ENCOUNTER — Other Ambulatory Visit: Payer: Self-pay

## 2020-03-06 ENCOUNTER — Encounter: Payer: Self-pay | Admitting: Cardiothoracic Surgery

## 2020-03-06 ENCOUNTER — Ambulatory Visit
Admission: RE | Admit: 2020-03-06 | Discharge: 2020-03-06 | Disposition: A | Discharge status: Home / Self Care | Payer: BC Managed Care – PPO | Source: Ambulatory Visit | Attending: Cardiothoracic Surgery | Admitting: Cardiothoracic Surgery

## 2020-03-06 VITALS — BP 115/77 | HR 97 | Temp 97.5°F | Resp 16 | Ht 69.0 in | Wt 210.0 lb

## 2020-03-06 DIAGNOSIS — I712 Thoracic aortic aneurysm, without rupture, unspecified: Secondary | ICD-10-CM

## 2020-03-06 DIAGNOSIS — Q231 Congenital insufficiency of aortic valve: Secondary | ICD-10-CM

## 2020-03-06 MED ORDER — IOPAMIDOL (ISOVUE-370) INJECTION 76%
75.0000 mL | Freq: Once | INTRAVENOUS | Status: AC | PRN
  Administered 2020-03-06: 75 mL via INTRAVENOUS

## 2020-03-06 NOTE — Progress Notes (Signed)
[address]       [place] [id number]             [phone]                    Vivien PrestoAlan D Teaster [place] Medical Record #[id number] Date of Birth: 06/25/1966  Referring: Martha ClanShaw, William, MD Primary Care: Martha ClanShaw, William, MD Cardiology: Dr Jens Somrenshaw  Chief Complaint:    Chief Complaint  Patient presents with  . Thoracic Aortic Dissection    8 month f/u with CTA CHEST today    History of Present Illness:  Patient is a 54 -year-old male followed in the office for dilated ascending aorta with known bicuspid aortic valve without evidence of aortic insufficiency or aortic stenosis.     Patient was initially seen in 2018 with a limited CT done for cardiac calcium scoring .  His calcium score was 0 but he was noted to have a 4.5 cm ascending aortic aneurysm.  An echocardiogram suggested a bicuspid valve.    Patient's family history is significant for his father who died at age 52 about 2 hours after sudden collapse while working at his desk.  He was presumed he had a myocardial infarction but no definite diagnosis was made.  His paternal grandfather died in his early 3860s of sudden collapse while walking down the street.  Again no definite diagnosis was made.  He has 2 sisters who are in their 1660s who have been evaluated for dilated aorta,   Current Activity/ Functional Status:  Patient is independent with mobility/ambulation, transfers, ADL's, IADL's.   Zubrod Score: At the time of surgery this patient's most appropriate activity status/level should be described as: [x]     0    Normal activity, no symptoms []     1    Restricted in physical strenuous activity but ambulatory, able to do out light work []     2    Ambulatory and capable of self care, unable to do work activities, up and about               >50 % of waking hours                              []     3    Only limited self care, in bed greater than 50% of waking hours []     4    Completely disabled, no self  care, confined to bed or chair []     5    Moribund   Past Medical History:  Diagnosis Date  . Allergy   . Thoracic aortic aneurysm [facility](HCC)     Past Surgical History:  Procedure Laterality Date  . CERVICAL DISC SURGERY     x3  . SIGMOIDOSCOPY      Family History  Problem Relation Age of Onset  . Alzheimer's disease Mother   . CAD Father   . Stroke Paternal Grandmother   . Heart disease Paternal Grandfather   . Ovarian cancer Maternal Grandmother   . Colon cancer Neg Hx   . Esophageal cancer Neg Hx   . Stomach cancer Neg Hx   . Rectal cancer Neg Hx     See above     Social History   Social History  . Marital status: Married    Spouse name: N/A  . Number of children: 4  . Years of education:  N/A   Occupational History  . 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 Smoking status: Never Smoker  . Smokeless tobacco: Never Used  . Alcohol use No  . Drug use: Unknown  . Sexual activity: Not on file     Social History   Tobacco Use  Smoking Status Never Smoker  Smokeless Tobacco Never Used    Social History   Substance and Sexual Activity  Alcohol Use No     Allergies  Allergen Reactions  . Pollen Extract Other (See Comments)    Runny nose, Itchy Eyes  . Quinolones     Patient was warned about not using Cipro and similar antibiotics. Recent studies have raised concern that fluoroquinolone antibiotics could be associated with an increased risk of aortic aneurysm Fluoroquinolones have non-antimicrobial properties that might jeopardise the integrity of the extracellular matrix of the vascular wall In a  propensity score matched cohort study in [place], there was a 66% increased rate of aortic aneurysm or dissection associated with oral fluoroquinolone use, compared wit    Current Outpatient Medications  Medication Sig Dispense Refill  . fluticasone (FLONASE) 50 MCG/ACT nasal spray Place 2 sprays into both nostrils daily.    .  sildenafil (REVATIO) 20 MG tablet TAKE 1-5 TABLETS BY MOUTH AS NEEDED  6  . triprolidine-pseudoephedrine (APRODINE) 2.5-60 MG TABS tablet Take 1 tablet by mouth every 6 (six) hours as needed for allergies.    . Cetirizine HCl (ZYRTEC ALLERGY) 10 MG CAPS Take 1 tablet by mouth daily.     No current facility-administered medications for this visit.    Pertinent items are noted in HPI.    Review of Systems:     Cardiac Review of Systems: Y or N  Chest Pain [ N Resting SOB [N   Exertional SOB  [N]  Orthopnea Klaus.Mock ]   Pedal Edema [ N ]    Palpitations [ N] Syncope  [N]   Presyncope Klaus.Mock ]  General Review of Systems: [Y] = yes [  ]=no Constitional: recent weight change [  ]; anorexia [  ]; fatigue [  ]; nausea [  ]; night sweats [  ]; fever [  ]; or chills [  ];                                                                                                                                          Dental: poor dentition[  ]; Last Dentist visit: Regular dental care  Eye : blurred vision [  ]; diplopia [   ]; vision changes [  ];  Amaurosis fugax[  ]; Resp: cough [  ];  wheezing[  ];  hemoptysis[  ]; shortness of breath[  ]; paroxysmal nocturnal dyspnea[  ]; dyspnea on exertion[  ]; or orthopnea[  ];  GI:  gallstones[  ], vomiting[  ];  dysphagia[  ]; melena[  ];  hematochezia [  ]; heartburn[  ];   Hx of  Colonoscopy[  ]; GU: kidney stones [  ]; hematuria[  ];   dysuria [  ];  nocturia[  ];  history of     obstruction [  ];                 Skin: rash, swelling[  ];, hair loss[  ];  peripheral edema[  ];  or itching[  ]; Musculosketetal: myalgias[  ];  joint swelling[  ];  joint erythema[  ];  joint pain[  ];  back pain[  ];  Heme/Lymph: bruising[  ];  bleeding[  ];  anemia[  ];  Neuro: TIA[  ];  headaches[  ];  stroke[  ];  vertigo[  ];  seizures[  ];   paresthesias[  ];  difficulty walking[  ];  Psych:depression[  ]; anxiety[  ];  Endocrine: diabetes[  ];  thyroid dysfunction[  ];  Immunizations:  Flu Gilian.Kraft  ]; Pneumococcal[Y  ];   Physical Exam: BP 115/77 (BP Location: Right Arm, Patient Position: Sitting, Cuff Size: Normal)   Pulse 97   Temp (!) 97.5 F (36.4 C)   Resp 16   Ht 5\' 9"  (1.753 m)   Wt 95.3 kg   SpO2 100% Comment: RA  BMI 31.01 kg/m   PHYSICAL EXAMINATION: General appearance: alert, cooperative and no distress Head: Normocephalic, without obvious abnormality, atraumatic Neck: no adenopathy, no carotid bruit, no JVD, supple, symmetrical, trachea midline and thyroid not enlarged, symmetric, no tenderness/mass/nodules Resp: clear to auscultation bilaterally Cardio: regular rate and rhythm, S1, S2 normal, no murmur, click, rub or gallop GI: soft, non-tender; bowel sounds normal; no masses,  no organomegaly Extremities: extremities normal, atraumatic, no cyanosis or edema and Homans sign is negative, no sign of DVT Neurologic: Grossly normalPatient has no stigmata of Marfan's or other congenital aortopathy.   Diagnostic Studies & Laboratory data:    ECHO:10/2018 Echocardiography  Patient:    Roemello, Speyer MR #:       [id number] Study Date: 11/17/2018 Gender:     M Age:        52 Height:     175.3 cm Weight:     95.3 kg BSA:        2.18 m^2 Pt. Status: Room:   ATTENDING    Sheliah Plane MD  ORDERING     Sheliah Plane MD  REFERRING    Sheliah Plane MD  PERFORMING   [facility], Outpatient  SONOGRAPHER  Sheralyn Boatman  cc:  ------------------------------------------------------------------- LV EF: 55% -   60%  ------------------------------------------------------------------- Indications:      Aneurysm - thoracic 441.2.  ------------------------------------------------------------------- History:   PMH:  Bicuspid aortic valve.  ------------------------------------------------------------------- Study Conclusions  - Left ventricle: The cavity size was normal. Wall thickness was   increased in a pattern of mild LVH. Systolic function was  normal.   The estimated ejection fraction was in the range of 55% to 60%.   Wall motion was normal; there were no regional wall motion   abnormalities. Left ventricular diastolic function parameters   were normal. - Aortic valve: Bicuspid valve. No significant stenosis. Mean   gradient (S): 11 mm Hg. Peak gradient (S): 19 mm Hg. Valve area   (VTI): 2.95 cm^2. Valve area (Vmax): 2.63 cm^2. Valve area   (Vmean): 2.62 cm^2. - Aorta: Ascending aortic diameter: 45 mm (S). - Ascending aorta: The ascending aorta was moderately  dilated. - Left atrium: The atrium was normal in size. - Tricuspid valve: There was trivial regurgitation. - Pulmonary arteries: PA peak pressure: 23 mm Hg (S). - Inferior vena cava: The vessel was normal in size. The   respirophasic diameter changes were in the normal range (>= 50%),   consistent with normal central venous pressure.  Impressions:  - Compared to a prior study in 2018, there are few changes. The   ascending aorta may be slightly larger at 4.5 cm.  ------------------------------------------------------------------- Study data:  Comparison was made to the study of 07/26/2017.  Study status:  Routine.  Procedure:  The patient reported no pain pre or post test. Transthoracic echocardiography. Image quality was adequate.  Study completion:  There were no complications. Echocardiography.  M-mode, complete 2D, spectral Doppler, and color Doppler.  Birthdate:  Patient birthdate: 1966/07/08.  Age:  Patient is 54 yr old.  Sex:  Gender: male.    BMI: 31 kg/m^2.  Blood pressure:     106/70  Patient status:  Outpatient.  Study date: Study date: 11/17/2018. Study time: 08:00 AM.  Location:  Echo laboratory.  -------------------------------------------------------------------  ------------------------------------------------------------------- Left ventricle:  The cavity size was normal. Wall thickness was increased in a pattern of mild LVH. Systolic  function was normal. The estimated ejection fraction was in the range of 55% to 60%. Wall motion was normal; there were no regional wall motion abnormalities. The transmitral flow pattern was normal. The deceleration time of the early transmitral flow velocity was normal. The pulmonary vein flow pattern was normal. The tissue Doppler parameters were normal. Left ventricular diastolic function parameters were normal.  ------------------------------------------------------------------- Aortic valve:  Bicuspid valve. No significant stenosis.  Doppler:   VTI ratio of LVOT to aortic valve: 0.65. Valve area (VTI): 2.95 cm^2. Indexed valve area (VTI): 1.35 cm^2/m^2. Peak velocity ratio of LVOT to aortic valve: 0.58. Valve area (Vmax): 2.63 cm^2. Indexed valve area (Vmax): 1.21 cm^2/m^2. Mean velocity ratio of LVOT to aortic valve: 0.58. Valve area (Vmean): 2.62 cm^2. Indexed valve area (Vmean): 1.2 cm^2/m^2.    Mean gradient (S): 11 mm Hg. Peak gradient (S): 19 mm Hg.  ------------------------------------------------------------------- Aorta:  Ascending aorta: The ascending aorta was moderately dilated.  ------------------------------------------------------------------- Mitral valve:   Structurally normal valve.   Leaflet separation was normal.  Doppler:  Transvalvular velocity was within the normal range. There was no evidence for stenosis. There was no regurgitation.    Valve area by pressure half-time: 3.33 cm^2. Indexed valve area by pressure half-time: 1.53 cm^2/m^2.    Peak gradient (D): 3 mm Hg.  ------------------------------------------------------------------- Left atrium:  The atrium was normal in size.  ------------------------------------------------------------------- Atrial septum:  No defect or patent foramen ovale was identified.   ------------------------------------------------------------------- Right ventricle:  The cavity size was normal. Wall thickness  was normal. Systolic function was normal.  ------------------------------------------------------------------- Pulmonic valve:    The valve appears to be grossly normal. Doppler:  There was no significant regurgitation.  ------------------------------------------------------------------- Tricuspid valve:   Doppler:  There was trivial regurgitation.  ------------------------------------------------------------------- Pulmonary artery:   The main pulmonary artery was normal-sized.  ------------------------------------------------------------------- Right atrium:  The atrium was normal in size.  ------------------------------------------------------------------- Pericardium:  There was no pericardial effusion.  ------------------------------------------------------------------- Systemic veins: Inferior vena cava: The vessel was normal in size. The respirophasic diameter changes were in the normal range (>= 50%), consistent with normal central venous pressure.  ------------------------------------------------------------------- Measurements   Left ventricle  Value          Reference  LV ID, ED, PLAX chordal                  52    mm       43 - 52  LV ID, ES, PLAX chordal          (H)     39    mm       23 - 38  LV fx shortening, PLAX chordal   (L)     25    %        >=29  LV PW thickness, ED                      11    mm       ----------  IVS/LV PW ratio, ED                      1              <=1.3  Stroke volume, 2D                        140   ml       ----------  Stroke volume/bsa, 2D                    64    ml/m^2   ----------  LV ejection fraction, 1-p A4C            56    %        ----------  LV end-diastolic volume, 2-p             83    ml       ----------  LV end-systolic volume, 2-p              35    ml       ----------  LV ejection fraction, 2-p                58    %        ----------  Stroke volume, 2-p                       48     ml       ----------  LV end-diastolic volume/bsa, 2-p         38    ml/m^2   ----------  LV end-systolic volume/bsa, 2-p          16    ml/m^2   ----------  Stroke volume/bsa, 2-p                   22.1  ml/m^2   ----------  LV e&', lateral                           11    cm/s     ----------  LV E/e&', lateral                         8.46           ----------  LV e&', medial                            9.9  cm/s     ----------  LV E/e&', medial                          9.4            ----------  LV e&', average                           10.45 cm/s     ----------  LV E/e&', average                         8.91           ----------    Ventricular septum                       Value          Reference  IVS thickness, ED                        11    mm       ----------    LVOT                                     Value          Reference  LVOT ID, S                               24    mm       ----------  LVOT area                                4.52  cm^2     ----------  LVOT peak velocity, S                    127   cm/s     ----------  LVOT mean velocity, S                    92    cm/s     ----------  LVOT VTI, S                              31    cm       ----------  LVOT peak gradient, S                    6     mm Hg    ----------    Aortic valve                             Value          Reference  Aortic valve peak velocity, S            218   cm/s     ----------  Aortic valve mean velocity, S            159   cm/s     ----------  Aortic valve VTI, S  47.5  cm       ----------  Aortic mean gradient, S                  11    mm Hg    ----------  Aortic peak gradient, S                  19    mm Hg    ----------  VTI ratio, LVOT/AV                       0.65           ----------  Aortic valve area, VTI                   2.95  cm^2     ----------  Aortic valve area/bsa, VTI               1.35  cm^2/m^2 ----------  Velocity ratio, peak, LVOT/AV            0.58            ----------  Aortic valve area, peak velocity         2.63  cm^2     ----------  Aortic valve area/bsa, peak              1.21  cm^2/m^2 ----------  velocity  Velocity ratio, mean, LVOT/AV            0.58           ----------  Aortic valve area, mean velocity         2.62  cm^2     ----------  Aortic valve area/bsa, mean              1.2   cm^2/m^2 ----------  velocity    Aorta                                    Value          Reference  Aortic root ID, ED                       36    mm       ----------  Ascending aorta ID, A-P, S               45    mm       ----------    Left atrium                              Value          Reference  LA ID, A-P, ES                           27    mm       ----------  LA ID/bsa, A-P                           1.24  cm/m^2   <=2.2  LA volume, S                             35.3  ml       ----------  LA volume/bsa, S                         16.2  ml/m^2   ----------  LA volume, ES, 1-p A4C                   24.7  ml       ----------  LA volume/bsa, ES, 1-p A4C               11.3  ml/m^2   ----------  LA volume, ES, 1-p A2C                   44.8  ml       ----------  LA volume/bsa, ES, 1-p A2C               20.5  ml/m^2   ----------    Mitral valve                             Value          Reference  Mitral E-wave peak velocity              93.1  cm/s     ----------  Mitral A-wave peak velocity              58.7  cm/s     ----------  Mitral deceleration time                 225   ms       150 - 230  Mitral pressure half-time                66    ms       ----------  Mitral peak gradient, D                  3     mm Hg    ----------  Mitral E/A ratio, peak                   1.6            ----------  Mitral valve area, PHT, DP               3.33  cm^2     ----------  Mitral valve area/bsa, PHT, DP           1.53  cm^2/m^2 ----------    Pulmonary arteries                       Value          Reference  PA pressure, S, DP                       23     mm Hg    <=30    Tricuspid valve                          Value          Reference  Tricuspid regurg peak velocity           222   cm/s     ----------  Tricuspid peak RV-RA gradient            20    mm Hg    ----------  Right atrium                             Value          Reference  RA ID, S-I, ES, A4C                      47    mm       34 - 49  RA area, ES, A4C                         15    cm^2     8.3 - 19.5  RA volume, ES, A/L                       40    ml       ----------  RA volume/bsa, ES, A/L                   18.3  ml/m^2   ----------    Systemic veins                           Value          Reference  Estimated CVP                            3     mm Hg    ----------    Right ventricle                          Value          Reference  TAPSE                                    28.7  mm       ----------  RV pressure, S, DP                       23    mm Hg    <=30  RV s&', lateral, S                        13.6  cm/s     ----------  Legend: (L)  and  (H)  mark values outside specified reference range.  ------------------------------------------------------------------- Prepared and Electronically Authenticated by  Lyman Bishop MD 2019-11-29T12:58:04 Echocardiography    Recent Radiology Findings:  CT ANGIO CHEST AORTA W/CM & OR WO/CM  Result Date: 03/06/2020 CLINICAL DATA:  Thoracic aortic aneurysm. EXAM: CT ANGIOGRAPHY CHEST WITH CONTRAST TECHNIQUE: Multidetector CT imaging of the chest was performed using the standard protocol during bolus administration of intravenous contrast. Multiplanar CT image reconstructions and MIPs were obtained to evaluate the vascular anatomy. CONTRAST:  20mL ISOVUE-370 IOPAMIDOL (ISOVUE-370) INJECTION 76% COMPARISON:  June 20, 2019. FINDINGS: Cardiovascular: Grossly stable 4.5 cm ascending thoracic aortic aneurysm is noted. Transverse aortic arch measures 2.4 cm. Descending thoracic aorta measures 22 mm. No dissection is noted.  Normal cardiac size. No pericardial effusion. Mediastinum/Nodes: No enlarged mediastinal, hilar, or axillary lymph nodes. Thyroid gland, trachea, and esophagus demonstrate no significant findings. Lungs/Pleura: Lungs are clear. No pleural effusion or pneumothorax.  Upper Abdomen: No acute abnormality. Musculoskeletal: No chest wall abnormality. No acute or significant osseous findings. Review of the MIP images confirms the above findings. IMPRESSION: Grossly stable 4.5 cm ascending thoracic aortic aneurysm. Recommend semi-annual imaging followup by CTA or MRA and referral to cardiothoracic surgery if not already obtained. This recommendation follows 2010 ACCF/AHA/AATS/ACR/ASA/SCA/SCAI/SIR/STS/SVM Guidelines for the Diagnosis and Management of Patients With Thoracic Aortic Disease. Circulation. 2010; 121: U932-T557. Aortic aneurysm NOS (ICD10-I71.9). Electronically Signed   By: Lupita Raider M.D.   On: 03/06/2020 12:59   I have independently reviewed the above radiology studies  and reviewed the findings with the patient.    Ct Angio Chest Aorta W &/or Wo Contrast  Result Date: 06/20/2019 CLINICAL DATA:  Thoracic aortic prominence EXAM: CT ANGIOGRAPHY CHEST WITH CONTRAST TECHNIQUE: Multidetector CT imaging of the chest was performed using the standard protocol during bolus administration of intravenous contrast. Multiplanar CT image reconstructions and MIPs were obtained to evaluate the vascular anatomy. CONTRAST:  66mL ISOVUE-370 IOPAMIDOL (ISOVUE-370) INJECTION 76% COMPARISON:  October 12, 2017 coronary CT FINDINGS: Cardiovascular: The ascending thoracic aorta has a measured transverse diameter of 4.6 x 4.6 cm. At the sinotubular junction measured on the coronal sequence, the measured transverse diameter is 3.9 cm. At the arch, the measured diameter is 2.9 cm. The measured diameter of the descending thoracic aorta at the main pulmonary outflow tract measures 2.6 x 2.5 cm. There is no thoracic aortic  dissection. Visualized great vessels appear unremarkable. There is no pericardial effusion or pericardial thickening. There is no demonstrable pulmonary embolus. Mediastinum/Nodes: There are scattered subcentimeter nodular opacities in the thyroid. No dominant thyroid mass evident. Thyroid otherwise appears unremarkable. There is no appreciable thoracic adenopathy. No esophageal lesions are evident. Lungs/Pleura: There is mild scarring and atelectatic change in the medial aspect of the posterior segment of the right lower lobe. There is no frank airspace consolidation or edema. No pleural effusion or pleural thickening evident. Upper Abdomen: There is hepatic steatosis. Visualized upper abdominal structures otherwise appear unremarkable. Musculoskeletal: There are no blastic or lytic bone lesions. There is postoperative change in the lower cervical spine region. No chest wall lesions are evident. Review of the MIP images confirms the above findings. IMPRESSION: 1. Ascending thoracic aorta has a measured transverse diameter of 4.6 x 4.6 cm, essentially stable compared to prior study. No thoracic aortic dissection. Ascending thoracic aortic aneurysm. Recommend semi-annual imaging followup by CTA or MRA and referral to cardiothoracic surgery if not already obtained. This recommendation follows 2010 ACCF/AHA/AATS/ACR/ASA/SCA/SCAI/SIR/STS/SVM Guidelines for the Diagnosis and Management of Patients With Thoracic Aortic Disease. Circulation. 2010; 121: D220-U542. Aortic aneurysm NOS (ICD10-I71.9). 2.  No demonstrable pulmonary embolus. 3. Mild atelectatic change in scarring medial right base. No edema or consolidation. 4.  No evident thoracic adenopathy. 5.  Hepatic steatosis. Electronically Signed   By: Bretta Bang III M.D.   On: 06/20/2019 11:21   I have independently reviewed the above radiology studies  and reviewed the findings with the patient.   Mr Angiogram Chest W Wo Contrast  Result Date:  11/08/2018 CLINICAL DATA:  Follow-up thoracic aortic aneurysm. History of bicuspid aortic valve EXAM: MRA CHEST WITH CONTRAST TECHNIQUE: Angiographic images of the chest were obtained using MRA technique with intravenous contrast. CONTRAST:  19 mL MULTIHANCE GADOBENATE DIMEGLUMINE 529 MG/ML IV SOLN COMPARISON:  05/01/2018; 10/12/2017; 06/13/2017 FINDINGS: Vascular Findings: Stable uncomplicated fusiform aneurysmal dilatation of the ascending thoracic aorta with measurements as follows. The thoracic aorta tapers to a normal caliber  at the level of the aortic arch. Note is made of a small ductus diverticulum. The descending thoracic aorta is of normal caliber. Conventional configuration of the aortic arch. The branch vessels of the aortic arch appear widely patent throughout their imaged courses. Borderline cardiomegaly.  No pericardial effusion. Although this examination was not tailored for the evaluation the pulmonary arteries, there are no discrete filling defects within the central pulmonary arterial tree to suggest central pulmonary embolism. Normal caliber of the main pulmonary artery. ------------------------------------------------------------- Thoracic aortic measurements: Sinotubular junction 37 mm as measured in greatest oblique sagittal dimension, though evaluation degraded secondary to pulsation artifact. Proximal ascending aorta 44 mm as measured in greatest oblique axial dimension at the level of the main pulmonary artery (image 53, series 16) and approximately 45 mm in greatest oblique short axis sagittal diameter (sagittal image 57, series 13), grossly unchanged compared to the 09/2017 examination, previously, 44 and 45 mm in diameter respectively. Aortic arch aorta 26 mm as measured in greatest oblique sagittal dimension. Proximal descending thoracic aorta 21 mm as measured in greatest oblique axial dimension at the level of the main pulmonary artery. Distal descending thoracic aorta 21 mm as  measured in greatest oblique axial dimension at the level of the diaphragmatic hiatus. Review of the MIP images confirms the above findings. ------------------------------------------------------------- Non-Vascular Findings: Mediastinum/Lymph Nodes: No bulky mediastinal, hilar or axillary lymphadenopathy. Lungs/Pleura: Ill-defined slightly nodular/linear heterogeneous opacities are seen within the right lower lobe measuring approximately 1.0 x 0.6 cm (image 60, series 16). Minimal amount of adjacent presumed subsegmental atelectasis. No discrete focal airspace opacities. No pleural effusion. Upper abdomen: Limited evaluation of the upper abdomen is normal. Musculoskeletal: No definitive acute or aggressive osseous abnormalities. IMPRESSION: 1. Stable uncomplicated fusiform aneurysmal dilatation of the ascending thoracic aorta measuring 45 mm in maximal diameter, unchanged compared to the 09/2017 examination. 2. Aortic aneurysm NOS (ICD10-I71.9). 3. Borderline cardiomegaly. 4. Ill-defined right lower lobe nodular/linear heterogeneous opacities, potentially atelectasis though incompletely evaluated on the present examination. Correlation for infectious symptoms is advised. Electronically Signed   By: Simonne Come M.D.   On: 11/08/2018 17:31     Mr Angiogram Chest W Wo Contrast  Result Date: 05/02/2018 CLINICAL DATA:  54 year old male with bicuspid aortic valve and thoracic aortic aneurysm. EXAM: MRA CHEST WITH OR WITHOUT CONTRAST TECHNIQUE: Angiographic images of the chest were obtained using MRA technique 19 intravenous contrast. CONTRAST:  82mL MULTIHANCE GADOBENATE DIMEGLUMINE 529 MG/ML IV SOLN COMPARISON:  None. FINDINGS: VASCULAR Aorta: Unchanged appearance of the course, caliber, and contour of the thoracic aorta. Diameter measured today in the ascending aorta is approximately 4.5cm. No evidence of dissection flap. No peri-aortic fluid. No dilation of the descending thoracic aorta. Three-vessel arch with  patent branch vessels. Proximal cervical arteries demonstrate maintained flow signal. Heart: Heart size unremarkable. No peri-cardial effusion/thickening. Pulmonary Arteries: Unremarkable. NON-VASCULAR Unremarkable lungs. Unremarkable musculoskeletal elements. Surgical changes of the cervical region. Unremarkable upper abdomen. IMPRESSION: Unchanged size of the ascending aorta in this patient with known bicuspid aortic valve. Greatest diameter 4.5 cm. Aortic aneurysm NOS (ICD10-I71.9). Recommend semi-annual imaging followup by CTA or MRA and referral to cardiothoracic surgery if not already obtained. This recommendation follows 2010 ACCF/AHA/AATS/ACR/ASA/SCA/SCAI/SIR/STS/SVM Guidelines for the Diagnosis and Management of Patients With Thoracic Aortic Disease. Circulation. 2010; 121: M010-U725 Electronically Signed   By: Gilmer Mor D.O.   On: 05/02/2018 07:54    Ct Coronary Morph W/cta Cor W/score W/ca W/cm &/or Wo/cm  Addendum Date: 10/12/2017   ADDENDUM REPORT:  10/12/2017 16:48 CLINICAL DATA:  54 year old male with bicuspid aortic valve and ascending aortic aneurysm. EXAM: Cardiac/Coronary  CT TECHNIQUE: The patient was scanned on a [employer]. FINDINGS: A 120 kV prospective scan was triggered in the descending thoracic aorta at 111 HU's. Axial non-contrast 3 mm slices were carried out through the heart. The data set was analyzed on a dedicated work station and scored using the Agatson method. Gantry rotation speed was 250 msecs and collimation was .6 mm. No beta blockade and 0.8 mg of sl NTG was given. The 3D data set was reconstructed in 5% intervals of the 67-82 % of the R-R cycle. Diastolic phases were analyzed on a dedicated work station using MPR, MIP and VRT modes. The patient received 80 cc of contrast. Aortic Valve: Bicuspid with non-separated right and left coronary leaflets. Mild leaflet calcifications. Maximum diameter at the sinus level 41 mm. Aorta: Aneurysmal dilatation of the  ascending aorta with maximum diameter 45 x 45 mm, normal size of the aortic arch and descending thoracic aorta. No calcifications or dissection. Sinotubular junction:  35 x 35 mm Coronary Arteries:  Normal coronary origin.  Right dominance. RCA is a large dominant artery that gives rise to PDA and PLVB. There is no plaque. Left main is a large artery that gives rise to LAD and LCX arteries. LAD is a large vessel that gives rise to two diagonal arteries and has no plaque. LCX is a non-dominant artery that gives rise to two large OM1 branches. There is no plaque. Other findings: Normal pulmonary vein drainage into the left atrium. Normal let atrial appendage without a thrombus. Normal size of the pulmonary artery. IMPRESSION: 1. Coronary calcium score of 0. This was 0 percentile for age and sex matched control. 2. Normal coronary origin with right dominance. 3. No evidence of CAD. 4. Bicuspid with non-separated right and left coronary leaflets. Mild leaflet calcifications. Maximum diameter at the sinus level 41 mm. 5. Aneurysmal dilatation of the ascending aorta with maximum diameter 45 mm, normal size of the aortic arch and descending thoracic aorta. No calcifications or dissection. 6. No other congenital anomalies were identified. 7. No thrombus in the left atrial appendage. 8. Normal size of the pulmonary artery. Tobias Alexander Electronically Signed   By: Tobias Alexander   On: 10/12/2017 16:48   Result Date: 10/12/2017 EXAM: OVER-READ INTERPRETATION  CT CHEST The following report is an over-read performed by radiologist Dr. [facility] on 10/12/2017. This over-read does not include interpretation of cardiac or coronary anatomy or pathology. The coronary calcium score/coronary CTA interpretation by the cardiologist is attached. COMPARISON:  Coronary calcium score 06/13/2017. FINDINGS: Mild aneurysmal dilatation of the ascending thoracic aorta which measures up to 4.5 cm in  diameter. Previously noted 3 mm subpleural nodule in the left upper lobe is now sub mm in size. Previously noted subpleural right middle lobe nodule has slightly decreased in size measuring only 4 mm (axial image 43 of series 13), considered benign, likely a subpleural lymph node. Within the visualized portions of the thorax there are no larger more suspicious appearing pulmonary nodules or masses, there is no acute consolidative airspace disease, no pleural effusions, no pneumothorax and no lymphadenopathy. Visualized portions of the upper abdomen are unremarkable. There are no aggressive appearing lytic or blastic lesions noted in the visualized portions of the skeleton. IMPRESSION: 1. Mild aneurysmal dilatation of the ascending thoracic aorta measuring 4.5 cm in diameter. Ascending thoracic aortic aneurysm. Recommend semi-annual imaging  followup by CTA or MRA and referral to cardiothoracic surgery if not already obtained. This recommendation follows 2010 ACCF/AHA/AATS/ACR/ASA/SCA/SCAI/SIR/STS/SVM Guidelines for the Diagnosis and Management of Patients With Thoracic Aortic Disease. Circulation. 2010; 121: Z610-R604. 2. Previously noted tiny pulmonary nodules have decreased in size and are considered benign requiring no future imaging followup. Electronically Signed: By: Trudie Reed M.D. On: 10/12/2017 09:37         CLINICAL DATA:  Cliffton Asters male with family history of heart disease.  EXAM: CT HEART FOR CALCIUM SCORING  TECHNIQUE: CT heart was performed on a 64 channel system using prospective ECG gating.  A non-contrast exam for calcium scoring was performed.  Note that this exam targets the heart and the chest was not imaged in its entirety.  COMPARISON:  None.  FINDINGS: Technical quality: Good.  CORONARY CALCIUM  Total Agatston Score: 0  MESA database percentile:  0  OTHER FINDINGS:  Cardiovascular: Aneurysm of the ascending thoracic aorta measuring up to 4.5 cm.  Calcifications at the aortic root and aortic valve. No coronary artery calcifications. Normal caliber of the main pulmonary artery. Trace amount of pericardial fluid.  Mediastinum/Nodes: No lymph node enlargement in the visualized mediastinum. Visualized esophagus is unremarkable.  Lungs/Pleura: 5 mm nodule along the right minor fissure on sequence 4, image 10. No large pleural effusions. 4 mm pleural-based nodule in the left upper lobe on sequence 4, image 1.  Upper Abdomen: Negative  Musculoskeletal: No acute bone abnormality.  IMPRESSION: Coronary calcium score is 0.  Aneurysm of the ascending thoracic aorta measuring up to 4.5 cm. Ascending thoracic aortic aneurysm. Recommend semi-annual imaging followup by CTA or MRA and referral to cardiothoracic surgery if not already obtained. This recommendation follows 2010 ACCF/AHA/AATS/ACR/ASA/SCA/SCAI/SIR/STS/SVM Guidelines for the Diagnosis and Management of Patients With Thoracic Aortic Disease. Circulation. 2010; 121: V409-W119  Small indeterminate pulmonary nodules. No follow-up needed if patient is low-risk (and has no known or suspected primary neoplasm). Non-contrast chest CT can be considered in 12 months if patient is high-risk. This recommendation follows the consensus statement: Guidelines for Management of Incidental Pulmonary Nodules Detected on CT Images: From the Fleischner Society 2017; Radiology 2017; 284:228-243.   Electronically Signed   By: Richarda Overlie M.D.   On: 06/13/2017 14:09 I have independently reviewed the above radiologic studies.  Recent Lab Findings: Lab Results  Component Value Date   WBC 8.1 11/19/2010   HGB 14.5 11/19/2010   HCT 40.7 11/19/2010   PLT 302 11/19/2010   GLUCOSE 92 03/06/2009   NA 137 03/06/2009   K 3.8 03/06/2009   CL 104 03/06/2009   CREATININE 0.92 03/06/2009   BUN 13 03/06/2009   CO2 24 03/06/2009   Aortic Size Index=   4.5     /Body surface area is 2.15  meters squared. = 2.14 < 2.75 cm/m2      4% risk per year 2.75 to 4.25          8% risk per year > 4.25 cm/m2    20% risk per year    Assessment / Plan:   #1 mild dilatation of the ascending aorta in patient with known bicuspid aortic valve, without valve malfunction #2+ family history of sudden death in his father and paternal grandfather  I have reviewed with the patient CTA of the chest done today that shows consistent aortic size at 4.5 cm since initially seen in 2018.  With bicuspid aortic valve and family history suggestive of acute dissections continue follow close follow-up.  We will plan CTA of the chest in 10 to 11 months-patient is agreeable with this  I  spent 25 minutes with  the patient face to face and greater       Delight Ovens MD      [address] [place] [address] Office [phone]   Beeper [phone]  03/06/2020 2:17 PM

## 2020-10-01 IMAGING — CT CT ANGIOGRAPHY CHEST
2 of 6 series · 13 of 36 positions shown · IV contrast (iopamidol)
Comparison: October 12, 2017 coronary CT

CLINICAL DATA: Thoracic aortic prominence

EXAM:
CT ANGIOGRAPHY CHEST WITH CONTRAST
TECHNIQUE: Multidetector CT imaging of the chest was performed using the
standard protocol during bolus administration of intravenous
contrast. Multiplanar CT image reconstructions and MIPs were
obtained to evaluate the vascular anatomy.
CONTRAST:  75mL ZUJNWA-JIB IOPAMIDOL (ZUJNWA-JIB) INJECTION 76%

[Series 5: cta thorax 2.00 bv36 s3 axial arterial · axial · arterial · 0.62mm/px · z∈[+1275,+1587]mm · 12 of 186 slices shown]
[im 15/186  lung]
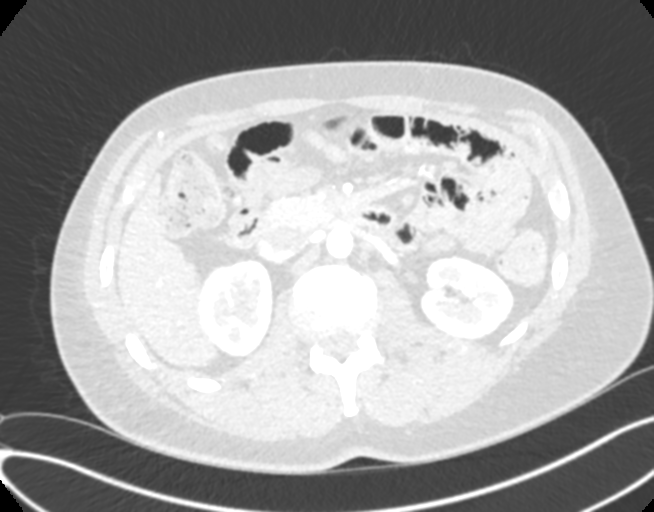
[im 29/186  mediastinal]
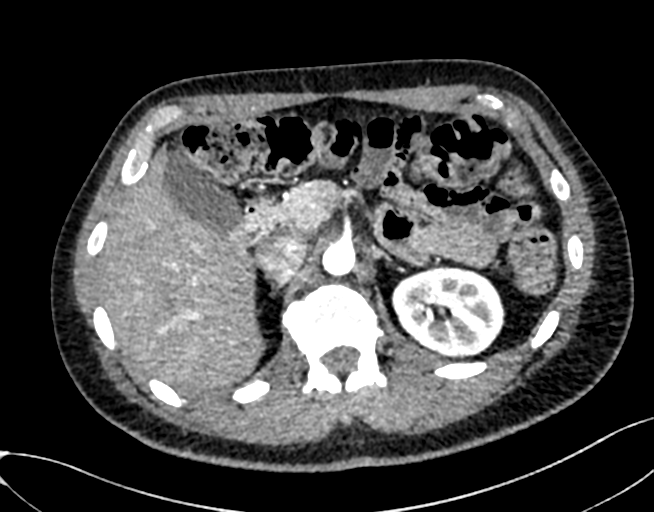
[im 43/186  lung]
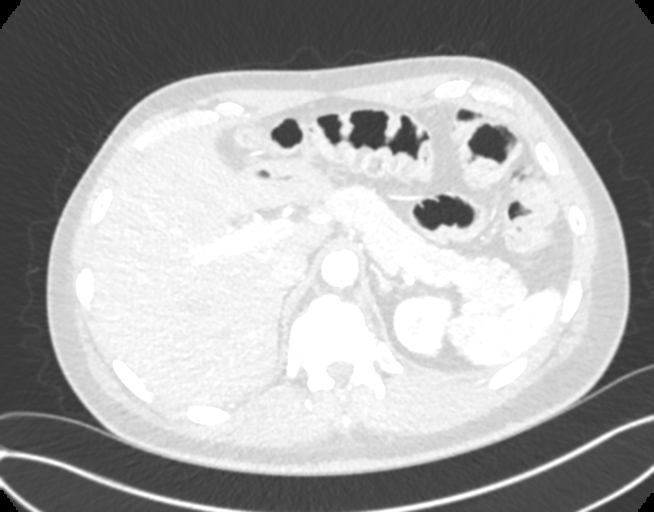
[im 57/186  mediastinal]
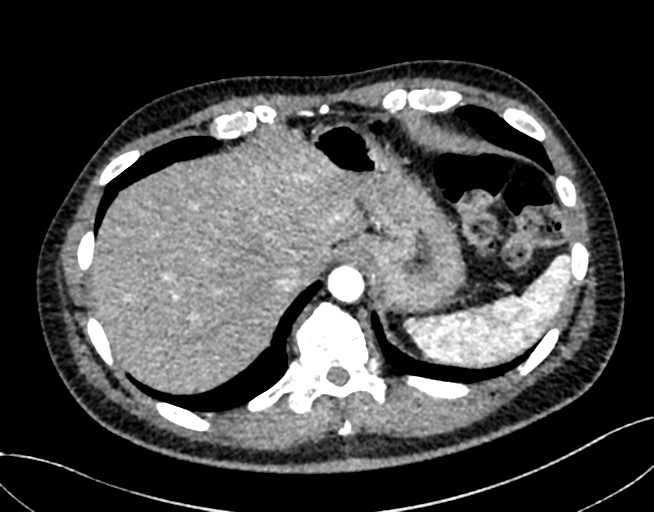
[im 72/186  lung]
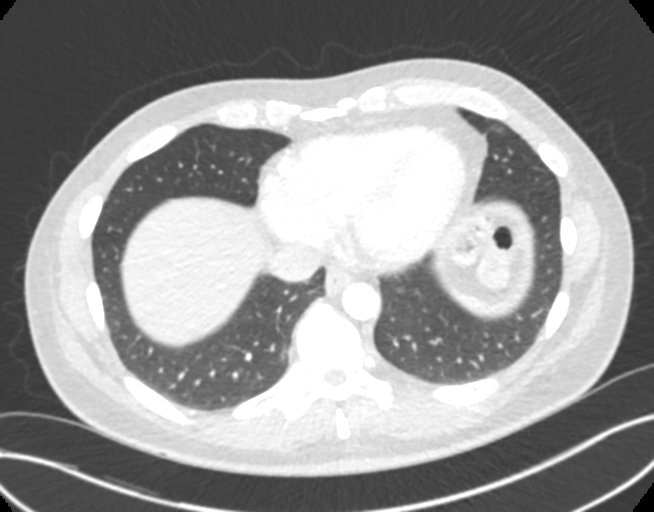
[im 86/186  mediastinal]
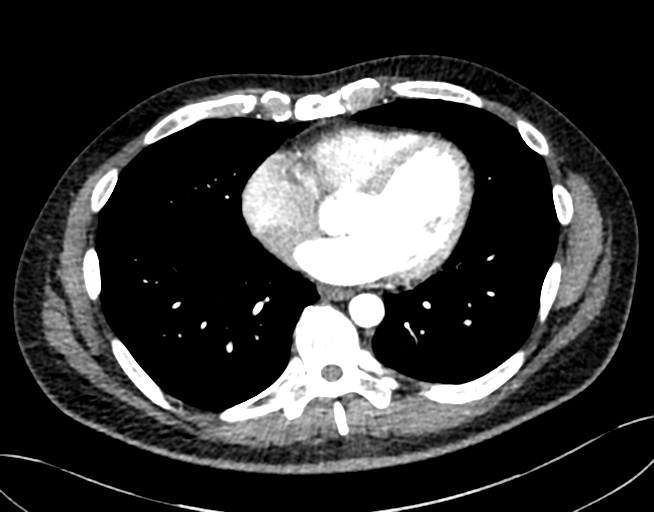
[im 100/186  lung]
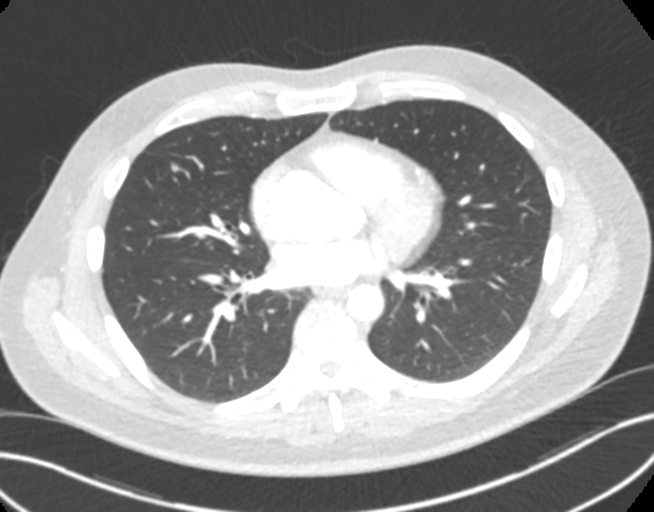
[im 114/186  mediastinal]
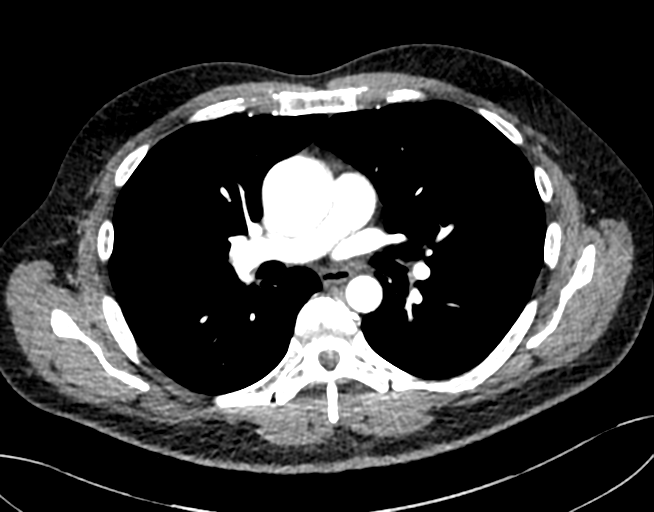
[im 129/186  lung]
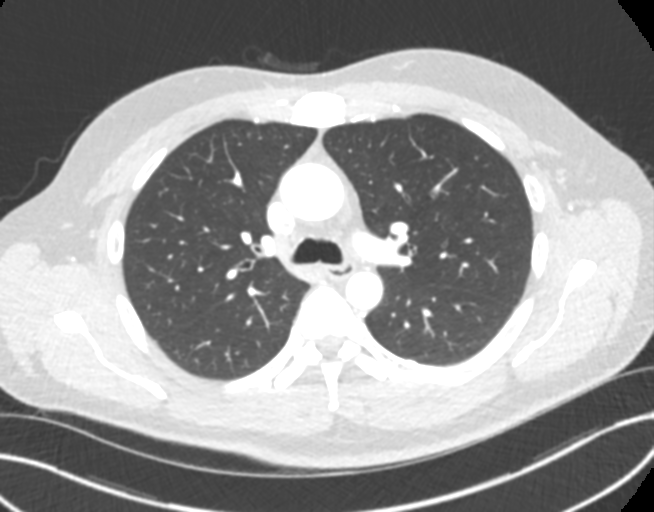
[im 143/186  mediastinal]
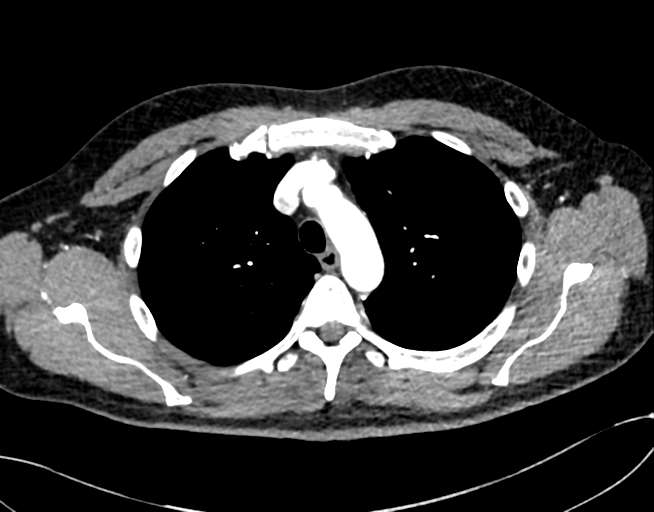
[im 157/186  lung]
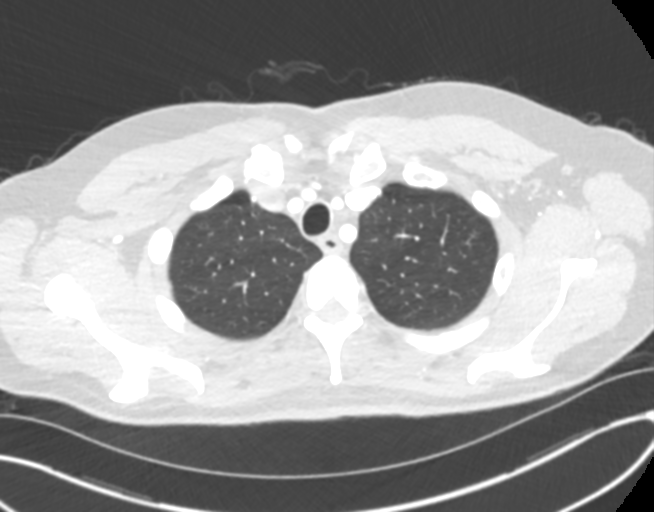
[im 171/186  mediastinal]
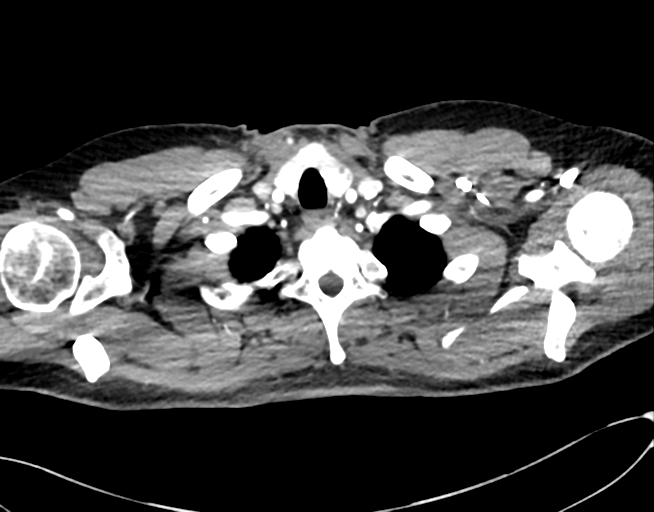

[Series 10: cta thorax 2.00 bv36 s3 cor st · coronal · 0.72mm/px · 1 of 149 slices shown]
[im 75/149  mediastinal]
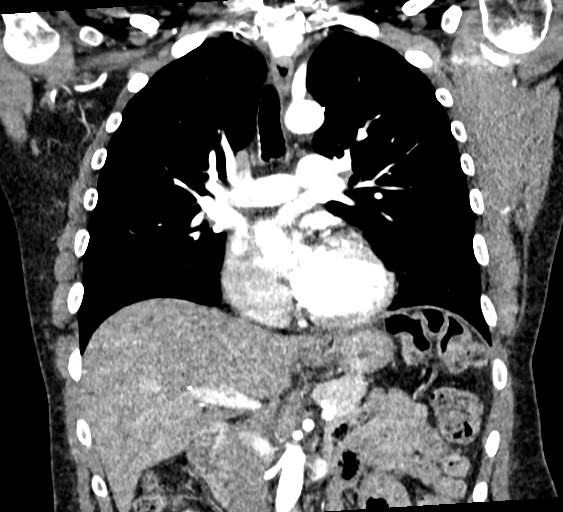

[13 of 36 positions shown; findings below may reference images not displayed]

FINDINGS: Cardiovascular: The ascending thoracic aorta has a measured
transverse diameter of 4.6 x 4.6 cm. At the sinotubular junction
measured on the coronal sequence, the measured transverse diameter
is 3.9 cm. At the arch, the measured diameter is 2.9 cm. The
measured diameter of the descending thoracic aorta at the main
pulmonary outflow tract measures 2.6 x 2.5 cm. There is no thoracic
aortic dissection. Visualized great vessels appear unremarkable.
There is no pericardial effusion or pericardial thickening. There is
no demonstrable pulmonary embolus.

Mediastinum/Nodes: There are scattered subcentimeter nodular
opacities in the thyroid. No dominant thyroid mass evident. Thyroid
otherwise appears unremarkable. There is no appreciable thoracic
adenopathy. No esophageal lesions are evident.

Lungs/Pleura: There is mild scarring and atelectatic change in the
medial aspect of the posterior segment of the right lower lobe.
There is no frank airspace consolidation or edema. No pleural
effusion or pleural thickening evident.

Upper Abdomen: There is hepatic steatosis. Visualized upper
abdominal structures otherwise appear unremarkable.

Musculoskeletal: There are no blastic or lytic bone lesions. There
is postoperative change in the lower cervical spine region. No chest
wall lesions are evident.

Review of the MIP images confirms the above findings.
IMPRESSION: 1. Ascending thoracic aorta has a measured transverse diameter of
4.6 x 4.6 cm, essentially stable compared to prior study. No
thoracic aortic dissection. Ascending thoracic aortic aneurysm.
Recommend semi-annual imaging followup by CTA or MRA and referral to
cardiothoracic surgery if not already obtained. This recommendation
follows 2202 ACCF/AHA/AATS/ACR/ASA/SCA/MINDI/BENRABAH/AMARDEEP/MANTSANE Guidelines
for the Diagnosis and Management of Patients With Thoracic Aortic
Disease. Circulation. 2202; 121: E266-e369. Aortic aneurysm NOS
([id number]).

2.  No demonstrable pulmonary embolus.

3. Mild atelectatic change in scarring medial right base. No edema
or consolidation.

4.  No evident thoracic adenopathy.

5.  Hepatic steatosis.

## 2020-10-16 DIAGNOSIS — Z125 Encounter for screening for malignant neoplasm of prostate: Secondary | ICD-10-CM | POA: Diagnosis not present

## 2020-10-16 DIAGNOSIS — Z0000 Encounter for general adult medical examination without abnormal findings: Secondary | ICD-10-CM | POA: Diagnosis not present

## 2020-10-16 DIAGNOSIS — N529 Male erectile dysfunction, unspecified: Secondary | ICD-10-CM | POA: Diagnosis not present

## 2020-10-16 DIAGNOSIS — R7301 Impaired fasting glucose: Secondary | ICD-10-CM | POA: Diagnosis not present

## 2020-10-18 DIAGNOSIS — Z23 Encounter for immunization: Secondary | ICD-10-CM | POA: Diagnosis not present

## 2020-10-23 DIAGNOSIS — R8281 Pyuria: Secondary | ICD-10-CM | POA: Diagnosis not present

## 2020-10-23 DIAGNOSIS — R7301 Impaired fasting glucose: Secondary | ICD-10-CM | POA: Diagnosis not present

## 2020-10-23 DIAGNOSIS — Q231 Congenital insufficiency of aortic valve: Secondary | ICD-10-CM | POA: Diagnosis not present

## 2020-10-23 DIAGNOSIS — Z1331 Encounter for screening for depression: Secondary | ICD-10-CM | POA: Diagnosis not present

## 2020-10-23 DIAGNOSIS — Z1339 Encounter for screening examination for other mental health and behavioral disorders: Secondary | ICD-10-CM | POA: Diagnosis not present

## 2020-10-23 DIAGNOSIS — Z0000 Encounter for general adult medical examination without abnormal findings: Secondary | ICD-10-CM | POA: Diagnosis not present

## 2020-10-23 DIAGNOSIS — R7989 Other specified abnormal findings of blood chemistry: Secondary | ICD-10-CM | POA: Diagnosis not present

## 2020-10-23 DIAGNOSIS — Z85828 Personal history of other malignant neoplasm of skin: Secondary | ICD-10-CM | POA: Diagnosis not present

## 2020-10-23 DIAGNOSIS — D649 Anemia, unspecified: Secondary | ICD-10-CM | POA: Diagnosis not present

## 2020-10-30 ENCOUNTER — Telehealth: Payer: Self-pay | Admitting: Cardiology

## 2020-10-30 ENCOUNTER — Encounter: Payer: Self-pay | Admitting: General Practice

## 2020-10-30 NOTE — Telephone Encounter (Signed)
  Recall expunge letter sent 

## 2020-11-26 DIAGNOSIS — Z1212 Encounter for screening for malignant neoplasm of rectum: Secondary | ICD-10-CM | POA: Diagnosis not present

## 2020-11-28 ENCOUNTER — Other Ambulatory Visit: Payer: Self-pay | Admitting: *Deleted

## 2020-11-28 DIAGNOSIS — I712 Thoracic aortic aneurysm, without rupture, unspecified: Secondary | ICD-10-CM

## 2021-01-12 NOTE — Progress Notes (Signed)
[address]       [place] [address]             [phone]                    [name] [place] Medical Record #[id number] Date of Birth: 25-Feb-1966  Referring: Martha Clan, MD Primary Care: Martha Clan, MD Cardiology: Dr Jens Som  Chief Complaint:    Chief Complaint  Patient presents with  . Thoracic Aortic Aneurysm    10 month f/u with CTA Chest    History of Present Illness: He has been vaccinated Patient returns to the office for continued follow-up of his dilated ascending aorta.  Since last seen he is notes no significant changes in his health status.  He has been vaccinated for Covid.  Notes that he is avoided any COVID infection.  He has  been   followed in the office for dilated ascending aorta with known bicuspid aortic valve without evidence of aortic insufficiency or aortic stenosis.    Patient was initially seen in 2018 with a limited CT done for cardiac calcium scoring .  His calcium score was 0 but he was noted to have a 4.5 cm ascending aortic aneurysm.  An echocardiogram suggested a bicuspid valve.    Patient's family history is significant for his father who died at age 34 about 2 hours after sudden collapse while working at his desk.  He was presumed he had a myocardial infarction but no definite diagnosis was made.  His paternal grandfather died in his early 27s of sudden collapse while walking down the street.  Again no definite diagnosis was made.  He has 2 sisters who are in their 66s who have been evaluated for dilated aorta,   Current Activity/ Functional Status:  Patient is independent with mobility/ambulation, transfers, ADL's, IADL's.   Zubrod Score: At the time of surgery this patient's most appropriate activity status/level should be described as: [x]     0    Normal activity, no symptoms []     1    Restricted in physical strenuous activity but ambulatory, able to do out light work []     2    Ambulatory and  capable of self care, unable to do work activities, up and about               >50 % of waking hours                              []     3    Only limited self care, in bed greater than 50% of waking hours []     4    Completely disabled, no self care, confined to bed or chair []     5    Moribund   Past Medical History:  Diagnosis Date  . Allergy   . Thoracic aortic aneurysm [facility])     Past Surgical History:  Procedure Laterality Date  . CERVICAL DISC SURGERY     x3  . SIGMOIDOSCOPY      Family History  Problem Relation Age of Onset  . Alzheimer's disease Mother   . CAD Father   . Stroke Paternal Grandmother   . Heart disease Paternal Grandfather   . Ovarian cancer Maternal Grandmother   . Colon cancer Neg Hx   . Esophageal cancer Neg Hx   . Stomach cancer Neg Hx   .  Rectal cancer Neg Hx     See above     Social History   Social History  . Marital status: Married    Spouse name: N/A  . Number of children: 4  . Years of education: N/A   Occupational History  . 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 Smoking status: Never Smoker  . Smokeless tobacco: Never Used  . Alcohol use No  . Drug use: Unknown  . Sexual activity: Not on file     Social History   Tobacco Use  Smoking Status Never Smoker  Smokeless Tobacco Never Used    Social History   Substance and Sexual Activity  Alcohol Use No     Allergies  Allergen Reactions  . Pollen Extract Other (See Comments)    Runny nose, Itchy Eyes  . Quinolones     Patient was warned about not using Cipro and similar antibiotics. Recent studies have raised concern that fluoroquinolone antibiotics could be associated with an increased risk of aortic aneurysm Fluoroquinolones have non-antimicrobial properties that might jeopardise the integrity of the extracellular matrix of the vascular wall In a  propensity score matched cohort study in [place], there was a 66% increased rate of aortic  aneurysm or dissection associated with oral fluoroquinolone use, compared wit    Current Outpatient Medications  Medication Sig Dispense Refill  . Cetirizine HCl (ZYRTEC ALLERGY) 10 MG CAPS Take 1 tablet by mouth daily.    . fluticasone (FLONASE) 50 MCG/ACT nasal spray Place 2 sprays into both nostrils daily.    . sildenafil (REVATIO) 20 MG tablet TAKE 1-5 TABLETS BY MOUTH AS NEEDED  6  . triprolidine-pseudoephedrine (APRODINE) 2.5-60 MG TABS tablet Take 1 tablet by mouth every 6 (six) hours as needed for allergies.     No current facility-administered medications for this visit.    Pertinent items are noted in HPI.    Review of Systems:     Cardiac Review of Systems: Y or N  Chest Pain [ N Resting SOB [N   Exertional SOB  [N]  Orthopnea Klaus.Mock ]   Pedal Edema [ N ]    Palpitations [ N] Syncope  [N]   Presyncope Klaus.Mock ]  General Review of Systems: [Y] = yes [  ]=no Constitional: recent weight change [  ]; anorexia [  ]; fatigue [  ]; nausea [  ]; night sweats [  ]; fever [  ]; or chills [  ];                                                                                                                                          Dental: poor dentition[  ]; Last Dentist visit: Regular dental care  Eye : blurred vision [  ]; diplopia [   ]; vision changes [  ];  Amaurosis fugax[  ];  Resp: cough [  ];  wheezing[  ];  hemoptysis[  ]; shortness of breath[  ]; paroxysmal nocturnal dyspnea[  ]; dyspnea on exertion[  ]; or orthopnea[  ];  GI:  gallstones[  ], vomiting[  ];  dysphagia[  ]; melena[  ];  hematochezia [  ]; heartburn[  ];   Hx of  Colonoscopy[  ]; GU: kidney stones [  ]; hematuria[  ];   dysuria [  ];  nocturia[  ];  history of     obstruction [  ];                 Skin: rash, swelling[  ];, hair loss[  ];  peripheral edema[  ];  or itching[  ]; Musculosketetal: myalgias[  ];  joint swelling[  ];  joint erythema[  ];  joint pain[  ];  back pain[  ];  Heme/Lymph: bruising[  ];  bleeding[   ];  anemia[  ];  Neuro: TIA[  ];  headaches[  ];  stroke[  ];  vertigo[  ];  seizures[  ];   paresthesias[  ];  difficulty walking[  ];  Psych:depression[  ]; anxiety[  ];  Endocrine: diabetes[  ];  thyroid dysfunction[  ];  Immunizations: Flu Gilian.Kraft  ]; Pneumococcal[Y  ]; COVID Y   Physical Exam: BP 128/80   Pulse 78   Temp 97.7 F (36.5 C) (Skin)   Resp 20   Ht 5\' 9"  (1.753 m)   Wt 210 lb (95.3 kg)   SpO2 99% Comment: RA  BMI 31.01 kg/m   PHYSICAL EXAMINATION: General appearance: alert, cooperative and appears stated age Head: Normocephalic, without obvious abnormality, atraumatic Neck: no adenopathy, no carotid bruit, no JVD, supple, symmetrical, trachea midline and thyroid not enlarged, symmetric, no tenderness/mass/nodules Lymph nodes: Cervical, supraclavicular, and axillary nodes normal. Resp: clear to auscultation bilaterally Back: negative, symmetric, no curvature. ROM normal. No CVA tenderness. Cardio: regular rate and rhythm, S1, S2 normal, no murmur, click, rub or gallop GI: soft, non-tender; bowel sounds normal; no masses,  no organomegaly Extremities: extremities normal, atraumatic, no cyanosis or edema and Homans sign is negative, no sign of DVT Neurologic: Grossly normal Patient has no stigmata of Marfan's or other congenital aortopathy.   Diagnostic Studies & Laboratory data:    ECHO:10/2018 Echocardiography  Patient:    Meliton, Samad MR #:       [id number] Study Date: 11/17/2018 Gender:     M Age:        55 Height:     175.3 cm Weight:     95.3 kg BSA:        2.18 m^2 Pt. Status: Room:   ATTENDING    Sheliah Plane MD  ORDERING     Sheliah Plane MD  REFERRING    Sheliah Plane MD  PERFORMING   [facility], Outpatient  SONOGRAPHER  Sheralyn Boatman  cc:  ------------------------------------------------------------------- LV EF: 55% -   60%  ------------------------------------------------------------------- Indications:      Aneurysm - thoracic  441.2.  ------------------------------------------------------------------- History:   PMH:  Bicuspid aortic valve.  ------------------------------------------------------------------- Study Conclusions  - Left ventricle: The cavity size was normal. Wall thickness was   increased in a pattern of mild LVH. Systolic function was normal.   The estimated ejection fraction was in the range of 55% to 60%.   Wall motion was normal; there were no regional wall motion   abnormalities. Left ventricular diastolic function parameters   were normal. -  Aortic valve: Bicuspid valve. No significant stenosis. Mean   gradient (S): 11 mm Hg. Peak gradient (S): 19 mm Hg. Valve area   (VTI): 2.95 cm^2. Valve area (Vmax): 2.63 cm^2. Valve area   (Vmean): 2.62 cm^2. - Aorta: Ascending aortic diameter: 45 mm (S). - Ascending aorta: The ascending aorta was moderately dilated. - Left atrium: The atrium was normal in size. - Tricuspid valve: There was trivial regurgitation. - Pulmonary arteries: PA peak pressure: 23 mm Hg (S). - Inferior vena cava: The vessel was normal in size. The   respirophasic diameter changes were in the normal range (>= 50%),   consistent with normal central venous pressure.  Impressions:  - Compared to a prior study in 2018, there are few changes. The   ascending aorta may be slightly larger at 4.5 cm.  ------------------------------------------------------------------- Study data:  Comparison was made to the study of 07/26/2017.  Study status:  Routine.  Procedure:  The patient reported no pain pre or post test. Transthoracic echocardiography. Image quality was adequate.  Study completion:  There were no complications. Echocardiography.  M-mode, complete 2D, spectral Doppler, and color Doppler.  Birthdate:  Patient birthdate: 10-Nov-1966.  Age:  Patient is 55 yr old.  Sex:  Gender: male.    BMI: 31 kg/m^2.  Blood pressure:     106/70  Patient status:  Outpatient.  Study  date: Study date: 11/17/2018. Study time: 08:00 AM.  Location:  Echo laboratory.  -------------------------------------------------------------------  ------------------------------------------------------------------- Left ventricle:  The cavity size was normal. Wall thickness was increased in a pattern of mild LVH. Systolic function was normal. The estimated ejection fraction was in the range of 55% to 60%. Wall motion was normal; there were no regional wall motion abnormalities. The transmitral flow pattern was normal. The deceleration time of the early transmitral flow velocity was normal. The pulmonary vein flow pattern was normal. The tissue Doppler parameters were normal. Left ventricular diastolic function parameters were normal.  ------------------------------------------------------------------- Aortic valve:  Bicuspid valve. No significant stenosis.  Doppler:   VTI ratio of LVOT to aortic valve: 0.65. Valve area (VTI): 2.95 cm^2. Indexed valve area (VTI): 1.35 cm^2/m^2. Peak velocity ratio of LVOT to aortic valve: 0.58. Valve area (Vmax): 2.63 cm^2. Indexed valve area (Vmax): 1.21 cm^2/m^2. Mean velocity ratio of LVOT to aortic valve: 0.58. Valve area (Vmean): 2.62 cm^2. Indexed valve area (Vmean): 1.2 cm^2/m^2.    Mean gradient (S): 11 mm Hg. Peak gradient (S): 19 mm Hg.  ------------------------------------------------------------------- Aorta:  Ascending aorta: The ascending aorta was moderately dilated.  ------------------------------------------------------------------- Mitral valve:   Structurally normal valve.   Leaflet separation was normal.  Doppler:  Transvalvular velocity was within the normal range. There was no evidence for stenosis. There was no regurgitation.    Valve area by pressure half-time: 3.33 cm^2. Indexed valve area by pressure half-time: 1.53 cm^2/m^2.    Peak gradient (D): 3 mm  Hg.  ------------------------------------------------------------------- Left atrium:  The atrium was normal in size.  ------------------------------------------------------------------- Atrial septum:  No defect or patent foramen ovale was identified.   ------------------------------------------------------------------- Right ventricle:  The cavity size was normal. Wall thickness was normal. Systolic function was normal.  ------------------------------------------------------------------- Pulmonic valve:    The valve appears to be grossly normal. Doppler:  There was no significant regurgitation.  ------------------------------------------------------------------- Tricuspid valve:   Doppler:  There was trivial regurgitation.  ------------------------------------------------------------------- Pulmonary artery:   The main pulmonary artery was normal-sized.  ------------------------------------------------------------------- Right atrium:  The atrium was normal in size.  ------------------------------------------------------------------- Pericardium:  There was no pericardial effusion.  ------------------------------------------------------------------- Systemic veins: Inferior vena cava: The vessel was normal in size. The respirophasic diameter changes were in the normal range (>= 50%), consistent with normal central venous pressure.  ------------------------------------------------------------------- Measurements   Left ventricle                           Value          Reference  LV ID, ED, PLAX chordal                  52    mm       43 - 52  LV ID, ES, PLAX chordal          (H)     39    mm       23 - 38  LV fx shortening, PLAX chordal   (L)     25    %        >=29  LV PW thickness, ED                      11    mm       ----------  IVS/LV PW ratio, ED                      1              <=1.3  Stroke volume, 2D                        140   ml        ----------  Stroke volume/bsa, 2D                    64    ml/m^2   ----------  LV ejection fraction, 1-p A4C            56    %        ----------  LV end-diastolic volume, 2-p             83    ml       ----------  LV end-systolic volume, 2-p              35    ml       ----------  LV ejection fraction, 2-p                58    %        ----------  Stroke volume, 2-p                       48    ml       ----------  LV end-diastolic volume/bsa, 2-p         38    ml/m^2   ----------  LV end-systolic volume/bsa, 2-p          16    ml/m^2   ----------  Stroke volume/bsa, 2-p                   22.1  ml/m^2   ----------  LV e&', lateral                           11    cm/s     ----------  LV E/e&', lateral  8.46           ----------  LV e&', medial                            9.9   cm/s     ----------  LV E/e&', medial                          9.4            ----------  LV e&', average                           10.45 cm/s     ----------  LV E/e&', average                         8.91           ----------    Ventricular septum                       Value          Reference  IVS thickness, ED                        11    mm       ----------    LVOT                                     Value          Reference  LVOT ID, S                               24    mm       ----------  LVOT area                                4.52  cm^2     ----------  LVOT peak velocity, S                    127   cm/s     ----------  LVOT mean velocity, S                    92    cm/s     ----------  LVOT VTI, S                              31    cm       ----------  LVOT peak gradient, S                    6     mm Hg    ----------    Aortic valve                             Value          Reference  Aortic valve peak velocity, S            218   cm/s     ----------  Aortic valve mean velocity, S            159   cm/s     ----------  Aortic valve VTI, S                      47.5  cm        ----------  Aortic mean gradient, S                  11    mm Hg    ----------  Aortic peak gradient, S                  19    mm Hg    ----------  VTI ratio, LVOT/AV                       0.65           ----------  Aortic valve area, VTI                   2.95  cm^2     ----------  Aortic valve area/bsa, VTI               1.35  cm^2/m^2 ----------  Velocity ratio, peak, LVOT/AV            0.58           ----------  Aortic valve area, peak velocity         2.63  cm^2     ----------  Aortic valve area/bsa, peak              1.21  cm^2/m^2 ----------  velocity  Velocity ratio, mean, LVOT/AV            0.58           ----------  Aortic valve area, mean velocity         2.62  cm^2     ----------  Aortic valve area/bsa, mean              1.2   cm^2/m^2 ----------  velocity    Aorta                                    Value          Reference  Aortic root ID, ED                       36    mm       ----------  Ascending aorta ID, A-P, S               45    mm       ----------    Left atrium                              Value          Reference  LA ID, A-P, ES                           27    mm       ----------  LA ID/bsa, A-P  1.24  cm/m^2   <=2.2  LA volume, S                             35.3  ml       ----------  LA volume/bsa, S                         16.2  ml/m^2   ----------  LA volume, ES, 1-p A4C                   24.7  ml       ----------  LA volume/bsa, ES, 1-p A4C               11.3  ml/m^2   ----------  LA volume, ES, 1-p A2C                   44.8  ml       ----------  LA volume/bsa, ES, 1-p A2C               20.5  ml/m^2   ----------    Mitral valve                             Value          Reference  Mitral E-wave peak velocity              93.1  cm/s     ----------  Mitral A-wave peak velocity              58.7  cm/s     ----------  Mitral deceleration time                 225   ms       150 - 230  Mitral pressure half-time                66    ms        ----------  Mitral peak gradient, D                  3     mm Hg    ----------  Mitral E/A ratio, peak                   1.6            ----------  Mitral valve area, PHT, DP               3.33  cm^2     ----------  Mitral valve area/bsa, PHT, DP           1.53  cm^2/m^2 ----------    Pulmonary arteries                       Value          Reference  PA pressure, S, DP                       23    mm Hg    <=30    Tricuspid valve                          Value          Reference  Tricuspid regurg peak  velocity           222   cm/s     ----------  Tricuspid peak RV-RA gradient            20    mm Hg    ----------    Right atrium                             Value          Reference  RA ID, S-I, ES, A4C                      47    mm       34 - 49  RA area, ES, A4C                         15    cm^2     8.3 - 19.5  RA volume, ES, A/L                       40    ml       ----------  RA volume/bsa, ES, A/L                   18.3  ml/m^2   ----------    Systemic veins                           Value          Reference  Estimated CVP                            3     mm Hg    ----------    Right ventricle                          Value          Reference  TAPSE                                    28.7  mm       ----------  RV pressure, S, DP                       23    mm Hg    <=30  RV s&', lateral, S                        13.6  cm/s     ----------  Legend: (L)  and  (H)  mark values outside specified reference range.  ------------------------------------------------------------------- Prepared and Electronically Authenticated by  Zoila ShutterKenneth Hilty MD 2019-11-29T12:58:04 Echocardiography    Recent Radiology Findings: CT ANGIO CHEST AORTA W/CM & OR WO/CM  Result Date: 01/15/2021 CLINICAL DATA:  Follow-up thoracic aneurysm EXAM: CT ANGIOGRAPHY CHEST WITH CONTRAST TECHNIQUE: Multidetector CT imaging of the chest was performed using the standard protocol during bolus administration of  intravenous contrast. Multiplanar CT image reconstructions and MIPs were obtained to evaluate the vascular anatomy. CONTRAST:  75mL ISOVUE-370 IOPAMIDOL (ISOVUE-370) INJECTION 76% COMPARISON:  03/06/2020 FINDINGS: Cardiovascular: Thoracic aorta shows a normal branching pattern. Dilatation of the ascending aorta is  noted to 4.5 cm. Normal tapering is noted in the thoracic arch. Descending thoracic aorta is within normal limits. Aortic root measures 3.7 cm at the level of the sinus of Valsalva. Sino-tubular junction measures 3.3 cm. Pulmonary artery as visualized is within normal limits. No cardiac enlargement is seen. No coronary calcifications are noted. Mediastinum/Nodes: Thoracic inlet is within normal limits. No sizable hilar or mediastinal adenopathy is noted. The esophagus is within normal limits. Lungs/Pleura: Lungs are well aerated bilaterally. Minimal scarring is noted in the medial aspect of the right lower lobe. No sizable parenchymal nodule is noted. Upper Abdomen: Visualized upper abdomen is within normal limits. Musculoskeletal: Degenerative changes of the thoracic spine are noted. Review of the MIP images confirms the above findings. IMPRESSION: Stable appearing ascending thoracic aortic aneurysm at 4.5 cm. Ascending thoracic aortic aneurysm. Recommend semi-annual imaging followup by CTA or MRA and referral to cardiothoracic surgery if not already obtained. This recommendation follows 2010 ACCF/AHA/AATS/ACR/ASA/SCA/SCAI/SIR/STS/SVM Guidelines for the Diagnosis and Management of Patients With Thoracic Aortic Disease. Circulation. 2010; 121: U440-H474: E266-e369. Aortic aneurysm NOS (ICD10-I71.9) No other focal abnormality is noted. Electronically Signed   By: Alcide CleverMark  Lukens M.D.   On: 01/15/2021 09:15   I have independently reviewed the above radiology studies  and reviewed the findings with the patient.  CT ANGIO CHEST AORTA W/CM & OR WO/CM  Result Date: 03/06/2020 CLINICAL DATA:  Thoracic aortic aneurysm.  EXAM: CT ANGIOGRAPHY CHEST WITH CONTRAST TECHNIQUE: Multidetector CT imaging of the chest was performed using the standard protocol during bolus administration of intravenous contrast. Multiplanar CT image reconstructions and MIPs were obtained to evaluate the vascular anatomy. CONTRAST:  75mL ISOVUE-370 IOPAMIDOL (ISOVUE-370) INJECTION 76% COMPARISON:  June 20, 2019. FINDINGS: Cardiovascular: Grossly stable 4.5 cm ascending thoracic aortic aneurysm is noted. Transverse aortic arch measures 2.4 cm. Descending thoracic aorta measures 22 mm. No dissection is noted. Normal cardiac size. No pericardial effusion. Mediastinum/Nodes: No enlarged mediastinal, hilar, or axillary lymph nodes. Thyroid gland, trachea, and esophagus demonstrate no significant findings. Lungs/Pleura: Lungs are clear. No pleural effusion or pneumothorax. Upper Abdomen: No acute abnormality. Musculoskeletal: No chest wall abnormality. No acute or significant osseous findings. Review of the MIP images confirms the above findings. IMPRESSION: Grossly stable 4.5 cm ascending thoracic aortic aneurysm. Recommend semi-annual imaging followup by CTA or MRA and referral to cardiothoracic surgery if not already obtained. This recommendation follows 2010 ACCF/AHA/AATS/ACR/ASA/SCA/SCAI/SIR/STS/SVM Guidelines for the Diagnosis and Management of Patients With Thoracic Aortic Disease. Circulation. 2010; 121: Q595-G387: E266-e369. Aortic aneurysm NOS (ICD10-I71.9). Electronically Signed   By: Lupita RaiderJames  Green Jr M.D.   On: 03/06/2020 12:59   Ct Angio Chest Aorta W &/or Wo Contrast  Result Date: 06/20/2019 CLINICAL DATA:  Thoracic aortic prominence EXAM: CT ANGIOGRAPHY CHEST WITH CONTRAST TECHNIQUE: Multidetector CT imaging of the chest was performed using the standard protocol during bolus administration of intravenous contrast. Multiplanar CT image reconstructions and MIPs were obtained to evaluate the vascular anatomy. CONTRAST:  75mL ISOVUE-370 IOPAMIDOL (ISOVUE-370)  INJECTION 76% COMPARISON:  October 12, 2017 coronary CT FINDINGS: Cardiovascular: The ascending thoracic aorta has a measured transverse diameter of 4.6 x 4.6 cm. At the sinotubular junction measured on the coronal sequence, the measured transverse diameter is 3.9 cm. At the arch, the measured diameter is 2.9 cm. The measured diameter of the descending thoracic aorta at the main pulmonary outflow tract measures 2.6 x 2.5 cm. There is no thoracic aortic dissection. Visualized great vessels appear unremarkable. There is no pericardial effusion or pericardial thickening. There is  no demonstrable pulmonary embolus. Mediastinum/Nodes: There are scattered subcentimeter nodular opacities in the thyroid. No dominant thyroid mass evident. Thyroid otherwise appears unremarkable. There is no appreciable thoracic adenopathy. No esophageal lesions are evident. Lungs/Pleura: There is mild scarring and atelectatic change in the medial aspect of the posterior segment of the right lower lobe. There is no frank airspace consolidation or edema. No pleural effusion or pleural thickening evident. Upper Abdomen: There is hepatic steatosis. Visualized upper abdominal structures otherwise appear unremarkable. Musculoskeletal: There are no blastic or lytic bone lesions. There is postoperative change in the lower cervical spine region. No chest wall lesions are evident. Review of the MIP images confirms the above findings. IMPRESSION: 1. Ascending thoracic aorta has a measured transverse diameter of 4.6 x 4.6 cm, essentially stable compared to prior study. No thoracic aortic dissection. Ascending thoracic aortic aneurysm. Recommend semi-annual imaging followup by CTA or MRA and referral to cardiothoracic surgery if not already obtained. This recommendation follows 2010 ACCF/AHA/AATS/ACR/ASA/SCA/SCAI/SIR/STS/SVM Guidelines for the Diagnosis and Management of Patients With Thoracic Aortic Disease. Circulation. 2010; 121: Z610-R604. Aortic  aneurysm NOS (ICD10-I71.9). 2.  No demonstrable pulmonary embolus. 3. Mild atelectatic change in scarring medial right base. No edema or consolidation. 4.  No evident thoracic adenopathy. 5.  Hepatic steatosis. Electronically Signed   By: Bretta Bang III M.D.   On: 06/20/2019 11:21   I have independently reviewed the above radiology studies  and reviewed the findings with the patient.   Mr Angiogram Chest W Wo Contrast  Result Date: 11/08/2018 CLINICAL DATA:  Follow-up thoracic aortic aneurysm. History of bicuspid aortic valve EXAM: MRA CHEST WITH CONTRAST TECHNIQUE: Angiographic images of the chest were obtained using MRA technique with intravenous contrast. CONTRAST:  19 mL MULTIHANCE GADOBENATE DIMEGLUMINE 529 MG/ML IV SOLN COMPARISON:  05/01/2018; 10/12/2017; 06/13/2017 FINDINGS: Vascular Findings: Stable uncomplicated fusiform aneurysmal dilatation of the ascending thoracic aorta with measurements as follows. The thoracic aorta tapers to a normal caliber at the level of the aortic arch. Note is made of a small ductus diverticulum. The descending thoracic aorta is of normal caliber. Conventional configuration of the aortic arch. The branch vessels of the aortic arch appear widely patent throughout their imaged courses. Borderline cardiomegaly.  No pericardial effusion. Although this examination was not tailored for the evaluation the pulmonary arteries, there are no discrete filling defects within the central pulmonary arterial tree to suggest central pulmonary embolism. Normal caliber of the main pulmonary artery. ------------------------------------------------------------- Thoracic aortic measurements: Sinotubular junction 37 mm as measured in greatest oblique sagittal dimension, though evaluation degraded secondary to pulsation artifact. Proximal ascending aorta 44 mm as measured in greatest oblique axial dimension at the level of the main pulmonary artery (image 53, series 16) and approximately  45 mm in greatest oblique short axis sagittal diameter (sagittal image 57, series 13), grossly unchanged compared to the 09/2017 examination, previously, 44 and 45 mm in diameter respectively. Aortic arch aorta 26 mm as measured in greatest oblique sagittal dimension. Proximal descending thoracic aorta 21 mm as measured in greatest oblique axial dimension at the level of the main pulmonary artery. Distal descending thoracic aorta 21 mm as measured in greatest oblique axial dimension at the level of the diaphragmatic hiatus. Review of the MIP images confirms the above findings. ------------------------------------------------------------- Non-Vascular Findings: Mediastinum/Lymph Nodes: No bulky mediastinal, hilar or axillary lymphadenopathy. Lungs/Pleura: Ill-defined slightly nodular/linear heterogeneous opacities are seen within the right lower lobe measuring approximately 1.0 x 0.6 cm (image 60, series 16). Minimal amount of adjacent presumed  subsegmental atelectasis. No discrete focal airspace opacities. No pleural effusion. Upper abdomen: Limited evaluation of the upper abdomen is normal. Musculoskeletal: No definitive acute or aggressive osseous abnormalities. IMPRESSION: 1. Stable uncomplicated fusiform aneurysmal dilatation of the ascending thoracic aorta measuring 45 mm in maximal diameter, unchanged compared to the 09/2017 examination. 2. Aortic aneurysm NOS (ICD10-I71.9). 3. Borderline cardiomegaly. 4. Ill-defined right lower lobe nodular/linear heterogeneous opacities, potentially atelectasis though incompletely evaluated on the present examination. Correlation for infectious symptoms is advised. Electronically Signed   By: Simonne Come M.D.   On: 11/08/2018 17:31     Mr Angiogram Chest W Wo Contrast  Result Date: 05/02/2018 CLINICAL DATA:  55 year old male with bicuspid aortic valve and thoracic aortic aneurysm. EXAM: MRA CHEST WITH OR WITHOUT CONTRAST TECHNIQUE: Angiographic images of the chest were  obtained using MRA technique 19 intravenous contrast. CONTRAST:  69mL MULTIHANCE GADOBENATE DIMEGLUMINE 529 MG/ML IV SOLN COMPARISON:  None. FINDINGS: VASCULAR Aorta: Unchanged appearance of the course, caliber, and contour of the thoracic aorta. Diameter measured today in the ascending aorta is approximately 4.5cm. No evidence of dissection flap. No peri-aortic fluid. No dilation of the descending thoracic aorta. Three-vessel arch with patent branch vessels. Proximal cervical arteries demonstrate maintained flow signal. Heart: Heart size unremarkable. No peri-cardial effusion/thickening. Pulmonary Arteries: Unremarkable. NON-VASCULAR Unremarkable lungs. Unremarkable musculoskeletal elements. Surgical changes of the cervical region. Unremarkable upper abdomen. IMPRESSION: Unchanged size of the ascending aorta in this patient with known bicuspid aortic valve. Greatest diameter 4.5 cm. Aortic aneurysm NOS (ICD10-I71.9). Recommend semi-annual imaging followup by CTA or MRA and referral to cardiothoracic surgery if not already obtained. This recommendation follows 2010 ACCF/AHA/AATS/ACR/ASA/SCA/SCAI/SIR/STS/SVM Guidelines for the Diagnosis and Management of Patients With Thoracic Aortic Disease. Circulation. 2010; 121: C623-J628 Electronically Signed   By: Gilmer Mor D.O.   On: 05/02/2018 07:54    Ct Coronary Morph W/cta Cor W/score W/ca W/cm &/or Wo/cm  Addendum Date: 10/12/2017   ADDENDUM REPORT: 10/12/2017 16:48 CLINICAL DATA:  55 year old male with bicuspid aortic valve and ascending aortic aneurysm. EXAM: Cardiac/Coronary  CT TECHNIQUE: The patient was scanned on a [employer]. FINDINGS: A 120 kV prospective scan was triggered in the descending thoracic aorta at 111 HU's. Axial non-contrast 3 mm slices were carried out through the heart. The data set was analyzed on a dedicated work station and scored using the Agatson method. Gantry rotation speed was 250 msecs and collimation was .6 mm. No  beta blockade and 0.8 mg of sl NTG was given. The 3D data set was reconstructed in 5% intervals of the 67-82 % of the R-R cycle. Diastolic phases were analyzed on a dedicated work station using MPR, MIP and VRT modes. The patient received 80 cc of contrast. Aortic Valve: Bicuspid with non-separated right and left coronary leaflets. Mild leaflet calcifications. Maximum diameter at the sinus level 41 mm. Aorta: Aneurysmal dilatation of the ascending aorta with maximum diameter 45 x 45 mm, normal size of the aortic arch and descending thoracic aorta. No calcifications or dissection. Sinotubular junction:  35 x 35 mm Coronary Arteries:  Normal coronary origin.  Right dominance. RCA is a large dominant artery that gives rise to PDA and PLVB. There is no plaque. Left main is a large artery that gives rise to LAD and LCX arteries. LAD is a large vessel that gives rise to two diagonal arteries and has no plaque. LCX is a non-dominant artery that gives rise to two large OM1 branches. There is no plaque. Other findings:  Normal pulmonary vein drainage into the left atrium. Normal let atrial appendage without a thrombus. Normal size of the pulmonary artery. IMPRESSION: 1. Coronary calcium score of 0. This was 0 percentile for age and sex matched control. 2. Normal coronary origin with right dominance. 3. No evidence of CAD. 4. Bicuspid with non-separated right and left coronary leaflets. Mild leaflet calcifications. Maximum diameter at the sinus level 41 mm. 5. Aneurysmal dilatation of the ascending aorta with maximum diameter 45 mm, normal size of the aortic arch and descending thoracic aorta. No calcifications or dissection. 6. No other congenital anomalies were identified. 7. No thrombus in the left atrial appendage. 8. Normal size of the pulmonary artery. Tobias Alexander Electronically Signed   By: Tobias Alexander   On: 10/12/2017 16:48   Result Date: 10/12/2017 EXAM: OVER-READ INTERPRETATION  CT CHEST The following  report is an over-read performed by radiologist Dr. [facility] on 10/12/2017. This over-read does not include interpretation of cardiac or coronary anatomy or pathology. The coronary calcium score/coronary CTA interpretation by the cardiologist is attached. COMPARISON:  Coronary calcium score 06/13/2017. FINDINGS: Mild aneurysmal dilatation of the ascending thoracic aorta which measures up to 4.5 cm in diameter. Previously noted 3 mm subpleural nodule in the left upper lobe is now sub mm in size. Previously noted subpleural right middle lobe nodule has slightly decreased in size measuring only 4 mm (axial image 43 of series 13), considered benign, likely a subpleural lymph node. Within the visualized portions of the thorax there are no larger more suspicious appearing pulmonary nodules or masses, there is no acute consolidative airspace disease, no pleural effusions, no pneumothorax and no lymphadenopathy. Visualized portions of the upper abdomen are unremarkable. There are no aggressive appearing lytic or blastic lesions noted in the visualized portions of the skeleton. IMPRESSION: 1. Mild aneurysmal dilatation of the ascending thoracic aorta measuring 4.5 cm in diameter. Ascending thoracic aortic aneurysm. Recommend semi-annual imaging followup by CTA or MRA and referral to cardiothoracic surgery if not already obtained. This recommendation follows 2010 ACCF/AHA/AATS/ACR/ASA/SCA/SCAI/SIR/STS/SVM Guidelines for the Diagnosis and Management of Patients With Thoracic Aortic Disease. Circulation. 2010; 121: Z610-R604. 2. Previously noted tiny pulmonary nodules have decreased in size and are considered benign requiring no future imaging followup. Electronically Signed: By: Trudie Reed M.D. On: 10/12/2017 09:37         CLINICAL DATA:  Cliffton Asters male with family history of heart disease.  EXAM: CT HEART FOR CALCIUM SCORING  TECHNIQUE: CT heart was performed on a 64 channel  system using prospective ECG gating.  A non-contrast exam for calcium scoring was performed.  Note that this exam targets the heart and the chest was not imaged in its entirety.  COMPARISON:  None.  FINDINGS: Technical quality: Good.  CORONARY CALCIUM  Total Agatston Score: 0  MESA database percentile:  0  OTHER FINDINGS:  Cardiovascular: Aneurysm of the ascending thoracic aorta measuring up to 4.5 cm. Calcifications at the aortic root and aortic valve. No coronary artery calcifications. Normal caliber of the main pulmonary artery. Trace amount of pericardial fluid.  Mediastinum/Nodes: No lymph node enlargement in the visualized mediastinum. Visualized esophagus is unremarkable.  Lungs/Pleura: 5 mm nodule along the right minor fissure on sequence 4, image 10. No large pleural effusions. 4 mm pleural-based nodule in the left upper lobe on sequence 4, image 1.  Upper Abdomen: Negative  Musculoskeletal: No acute bone abnormality.  IMPRESSION: Coronary calcium score is 0.  Aneurysm of the ascending thoracic  aorta measuring up to 4.5 cm. Ascending thoracic aortic aneurysm. Recommend semi-annual imaging followup by CTA or MRA and referral to cardiothoracic surgery if not already obtained. This recommendation follows 2010 ACCF/AHA/AATS/ACR/ASA/SCA/SCAI/SIR/STS/SVM Guidelines for the Diagnosis and Management of Patients With Thoracic Aortic Disease. Circulation. 2010; 121: W098-J191  Small indeterminate pulmonary nodules. No follow-up needed if patient is low-risk (and has no known or suspected primary neoplasm). Non-contrast chest CT can be considered in 12 months if patient is high-risk. This recommendation follows the consensus statement: Guidelines for Management of Incidental Pulmonary Nodules Detected on CT Images: From the Fleischner Society 2017; Radiology 2017; 284:228-243.   Electronically Signed   By: Richarda Overlie M.D.   On: 06/13/2017  14:09 I have independently reviewed the above radiologic studies.  Recent Lab Findings: Lab Results  Component Value Date   WBC 8.1 11/19/2010   HGB 14.5 11/19/2010   HCT 40.7 11/19/2010   PLT 302 11/19/2010   GLUCOSE 92 03/06/2009   NA 137 03/06/2009   K 3.8 03/06/2009   CL 104 03/06/2009   CREATININE 0.92 03/06/2009   BUN 13 03/06/2009   CO2 24 03/06/2009   Aortic Size Index=   4.5     /Body surface area is 2.15 meters squared. = 2.14 < 2.75 cm/m2      4% risk per year 2.75 to 4.25          8% risk per year > 4.25 cm/m2    20% risk per year    Assessment / Plan:   #1 mild dilatation of the ascending aorta in patient with known bicuspid aortic valve, without valve malfunction #2 positive  family history of sudden death in his father and paternal grandfather  Of reviewed the patient's CT scan with him, as well to avoid quinolones, maintain good blood pressure control and avoid strenuous lifting.  Patient has 2 college-age sons, we talked about the advisability of having them screened for aortic valve disease and dilated ascending aorta.  Patient is aware that he would need a follow-up CTA of the chest in 10 to 12 months.  This will be arranged through the surgical office.  Follow-up echocardiograms per cardiology.    Delight Ovens MD      [address] [place] [place] [address] Office [phone]   Beeper [phone]  01/18/2021 1:21 PM

## 2021-01-15 ENCOUNTER — Ambulatory Visit: Payer: BC Managed Care – PPO | Admitting: Cardiothoracic Surgery

## 2021-01-15 ENCOUNTER — Ambulatory Visit
Admission: RE | Admit: 2021-01-15 | Discharge: 2021-01-15 | Disposition: A | Discharge status: Home / Self Care | Payer: BC Managed Care – PPO | Source: Ambulatory Visit | Attending: Cardiothoracic Surgery | Admitting: Cardiothoracic Surgery

## 2021-01-15 ENCOUNTER — Other Ambulatory Visit: Payer: Self-pay

## 2021-01-15 VITALS — BP 128/80 | HR 78 | Temp 97.7°F | Resp 20 | Ht 69.0 in | Wt 210.0 lb

## 2021-01-15 DIAGNOSIS — Q231 Congenital insufficiency of aortic valve: Secondary | ICD-10-CM

## 2021-01-15 DIAGNOSIS — I712 Thoracic aortic aneurysm, without rupture, unspecified: Secondary | ICD-10-CM

## 2021-01-15 DIAGNOSIS — Z8249 Family history of ischemic heart disease and other diseases of the circulatory system: Secondary | ICD-10-CM

## 2021-01-15 DIAGNOSIS — J984 Other disorders of lung: Secondary | ICD-10-CM | POA: Diagnosis not present

## 2021-01-15 DIAGNOSIS — M47814 Spondylosis without myelopathy or radiculopathy, thoracic region: Secondary | ICD-10-CM | POA: Diagnosis not present

## 2021-01-15 MED ORDER — IOPAMIDOL (ISOVUE-370) INJECTION 76%
75.0000 mL | Freq: Once | INTRAVENOUS | Status: AC | PRN
  Administered 2021-01-15: 75 mL via INTRAVENOUS

## 2021-01-27 DIAGNOSIS — L814 Other melanin hyperpigmentation: Secondary | ICD-10-CM | POA: Diagnosis not present

## 2021-01-27 DIAGNOSIS — Z85828 Personal history of other malignant neoplasm of skin: Secondary | ICD-10-CM | POA: Diagnosis not present

## 2021-01-27 DIAGNOSIS — L821 Other seborrheic keratosis: Secondary | ICD-10-CM | POA: Diagnosis not present

## 2021-01-27 DIAGNOSIS — D229 Melanocytic nevi, unspecified: Secondary | ICD-10-CM | POA: Diagnosis not present

## 2021-08-12 DIAGNOSIS — I712 Thoracic aortic aneurysm, without rupture: Secondary | ICD-10-CM | POA: Diagnosis not present

## 2021-08-12 DIAGNOSIS — H8113 Benign paroxysmal vertigo, bilateral: Secondary | ICD-10-CM | POA: Diagnosis not present

## 2021-09-25 ENCOUNTER — Other Ambulatory Visit: Payer: Self-pay | Admitting: Surgery

## 2021-09-25 DIAGNOSIS — I7121 Aneurysm of the ascending aorta, without rupture: Secondary | ICD-10-CM

## 2021-09-29 DIAGNOSIS — Z23 Encounter for immunization: Secondary | ICD-10-CM | POA: Diagnosis not present

## 2021-11-04 ENCOUNTER — Ambulatory Visit
Admission: RE | Admit: 2021-11-04 | Discharge: 2021-11-04 | Disposition: A | Discharge status: Home / Self Care | Payer: BC Managed Care – PPO | Source: Ambulatory Visit | Attending: Surgery | Admitting: Surgery

## 2021-11-04 ENCOUNTER — Ambulatory Visit: Payer: BC Managed Care – PPO | Admitting: Surgery

## 2021-11-04 DIAGNOSIS — I7121 Aneurysm of the ascending aorta, without rupture: Secondary | ICD-10-CM

## 2021-11-04 DIAGNOSIS — I712 Thoracic aortic aneurysm, without rupture, unspecified: Secondary | ICD-10-CM | POA: Diagnosis not present

## 2021-11-04 MED ORDER — IOPAMIDOL (ISOVUE-370) INJECTION 76%
75.0000 mL | Freq: Once | INTRAVENOUS | Status: AC | PRN
  Administered 2021-11-04: 75 mL via INTRAVENOUS

## 2021-11-11 ENCOUNTER — Encounter: Payer: Self-pay | Admitting: Physician Assistant

## 2021-11-11 ENCOUNTER — Ambulatory Visit: Payer: BC Managed Care – PPO | Admitting: Physician Assistant

## 2021-11-11 ENCOUNTER — Other Ambulatory Visit: Payer: Self-pay

## 2021-11-11 VITALS — BP 123/82 | HR 80 | Resp 20 | Ht 69.0 in | Wt 225.0 lb

## 2021-11-11 DIAGNOSIS — Z8249 Family history of ischemic heart disease and other diseases of the circulatory system: Secondary | ICD-10-CM | POA: Diagnosis not present

## 2021-11-11 DIAGNOSIS — I7121 Aneurysm of the ascending aorta, without rupture: Secondary | ICD-10-CM

## 2021-11-11 NOTE — Progress Notes (Signed)
[address]       [place] [address]             [phone]       HPI: Adrian Murphy is a 55 year old male with known past history of bicuspid aortic valve in a sending aortic dilation.  He has a positive family history for sudden death in both his and grandfather.  He has been followed in our office for annual surveillance for the past few years.  At his last visit with Dr. Tyrone Sage in January of this year, his ascending aorta measured 4.5 cm and stable compared to previous images.  The last echo on file was done in 2019 and demonstrated a bicuspid aortic valve but no valvular dysfunction.  I do not see any record of cardiology follow-up in the past few years. Today, Adrian Murphy reports no changes in his health.  He feels well and has had no chest pain, shortness of breath, or concerns with circulation in his extremities.  He monitors his blood pressure occasionally at home rarely finds it above 120 systolic.   Current Outpatient Medications  Medication Sig Dispense Refill   Cetirizine HCl (ZYRTEC ALLERGY) 10 MG CAPS Take 1 tablet by mouth daily.     fluticasone (FLONASE) 50 MCG/ACT nasal spray Place 2 sprays into both nostrils daily.     sildenafil (REVATIO) 20 MG tablet TAKE 1-5 TABLETS BY MOUTH AS NEEDED  6   triprolidine-pseudoephedrine (APRODINE) 2.5-60 MG TABS tablet Take 1 tablet by mouth every 6 (six) hours as needed for allergies.     No current facility-administered medications for this visit.    Physical Exam:  Vital signs BP: 123/82 HR: 80 RR: 20 SPO2: 98%   Heart: Regular rate and rhythm, no murmur. Chest: Symmetrical, breath sounds clear to auscultation. Extremities: All well perfused  Diagnostic Tests:  CLINICAL DATA:  Thoracic aortic aneurysm (TAA), follow up   EXAM: CT ANGIOGRAPHY CHEST WITH CONTRAST   TECHNIQUE: Multidetector CT imaging of the chest was performed using the standard protocol during bolus administration of  intravenous contrast. Multiplanar CT image reconstructions and MIPs were obtained to evaluate the vascular anatomy.   CONTRAST:  77mL ISOVUE-370 IOPAMIDOL (ISOVUE-370) INJECTION 76%   COMPARISON:  01/15/2021   FINDINGS: Cardiovascular: Stable size of a ascending thoracic aortic aneurysm measuring 4.4 x 4.4 cm in diameter. Mid arch measures 2.4 cm in diameter. Descending thoracic aorta measures 2.2 cm in diameter. Negative for aortic dissection. Central pulmonary vasculature is nondilated. No central filling defects to suggest pulmonary embolism. Normal heart size. No pericardial effusion.   Mediastinum/Nodes: No axillary, mediastinal, or hilar lymphadenopathy. No significant thyroid findings. Trachea and esophagus within normal limits.   Lungs/Pleura: Lungs are clear. No pleural effusion or pneumothorax.   Upper Abdomen: No acute abnormality.   Musculoskeletal: No chest wall abnormality. No acute or significant osseous findings. Lower cervical ACDF.   Review of the MIP images confirms the above findings.   IMPRESSION: 1. Stable size of a 4.4 cm ascending thoracic aortic aneurysm. Recommend annual imaging followup by CTA or MRA. This recommendation follows 2010 ACCF/AHA/AATS/ACR/ASA/SCA/SCAI/SIR/STS/SVM Guidelines for the Diagnosis and Management of Patients with Thoracic Aortic Disease. Circulation. 2010; 121: H675-F163. Aortic aneurysm NOS (ICD10-I71.9) 2. Lungs are clear.     Electronically Signed   By: Duanne Guess D.O.   On: 11/04/2021 09:46    Impression: Stable ascending aortic dilation in a 55 year old male with known bicuspid aortic valve and positive family  history for sudden death in both his father and grandfather. Recommend continued careful surveillance of blood pressure (he has no history of hypertension), avoidance of fluoroquinolones (as these agents are known to weaken connective tissue), and avoidance of strenuous lifting.  Plan: Recommend  referral to cardiology for follow-up of his bicuspid valve with echocardiogram.  He has seen Dr. Jens Som in the past and prefers to follow-up with him. Plan to see Adrian Murphy in 1 year with repeat chest CT angiogram.   Leary Roca, PA-C [facility] [phone]

## 2021-11-11 NOTE — Patient Instructions (Signed)
Continue to monitor blood pressure  Avoid strenuous lifting  Avoid fluoroquinolones as these agents weaken connective tissue  Follow-up echocardiogram to reevaluate bicuspid aortic valve  We will make a referral to Dr. Jens Som for follow-up.  Repeat chest CT angiogram in 1 year.

## 2021-12-19 NOTE — Progress Notes (Signed)
Referring-[employer] Reason for referral-bicuspid aortic valve, aortic stenosis and thoracic aortic aneurysm  HPI: 55 year old male for evaluation of bicuspid aortic valve, aortic stenosis and thoracic aortic aneurysm at request of Jillyn Hidden, PA-C.  Patient seen previously but not since 2018. Patient had a cardiac CT in June 2018. Coronary calcium score was 0. He was noted to have a thoracic aortic aneurysm measuring 4.5 cm. Echocardiogram November 2019 showed normal LV function, mild left ventricular hypertrophy, bicuspid aortic valve with mild aortic stenosis (mean gradient 11 mmHg), dilated ascending aorta at 45 mm.  CTA November 2022 showed 4.4 cm ascending thoracic aortic aneurysm.  Patient denies dyspnea, chest pain, palpitations or syncope.  Cardiology now asked to evaluate.  Current Outpatient Medications  Medication Sig Dispense Refill   Cetirizine HCl 10 MG CAPS Take 1 tablet by mouth daily.     fluticasone (FLONASE) 50 MCG/ACT nasal spray Place 2 sprays into both nostrils daily.     sildenafil (REVATIO) 20 MG tablet TAKE 1-5 TABLETS BY MOUTH AS NEEDED  6   triprolidine-pseudoephedrine (APRODINE) 2.5-60 MG TABS tablet Take 1 tablet by mouth every 6 (six) hours as needed for allergies.     No current facility-administered medications for this visit.    Allergies  Allergen Reactions   Pollen Extract Other (See Comments)    Runny nose, Itchy Eyes   Quinolones     Patient was warned about not using Cipro and similar antibiotics. Recent studies have raised concern that fluoroquinolone antibiotics could be associated with an increased risk of aortic aneurysm Fluoroquinolones have non-antimicrobial properties that might jeopardise the integrity of the extracellular matrix of the vascular wall In a  propensity score matched cohort study in [place], there was a 66% increased rate of aortic aneurysm or dissection associated with oral fluoroquinolone use, compared wit      Past Medical History:  Diagnosis Date   Allergy    Aortic stenosis    Bicuspid aortic valve    Thoracic aortic aneurysm     Past Surgical History:  Procedure Laterality Date   CERVICAL DISC SURGERY     x3   SIGMOIDOSCOPY      Social History   Socioeconomic History   Marital status: Married    Spouse name: Not on file   Number of children: 4   Years of education: Not on file   Highest education level: Not on file  Occupational History   Not on file  Tobacco Use   Smoking status: Never   Smokeless tobacco: Never  Vaping Use   Vaping Use: Never used  Substance and Sexual Activity   Alcohol use: No   Drug use: No   Sexual activity: Yes  Other Topics Concern   Not on file  Social History Narrative   Not on file   Social Determinants of Health   Financial Resource Strain: Not on file  Food Insecurity: Not on file  Transportation Needs: Not on file  Physical Activity: Not on file  Stress: Not on file  Social Connections: Not on file  Intimate Partner Violence: Not on file    Family History  Problem Relation Age of Onset   Alzheimer's disease Mother    CAD Father    Stroke Paternal Grandmother    Heart disease Paternal Grandfather    Ovarian cancer Maternal Grandmother    Colon cancer Neg Hx    Esophageal cancer Neg Hx    Stomach cancer Neg Hx    Rectal  cancer Neg Hx     ROS: no fevers or chills, productive cough, hemoptysis, dysphasia, odynophagia, melena, hematochezia, dysuria, hematuria, rash, seizure activity, orthopnea, PND, pedal edema, claudication. Remaining systems are negative.  Physical Exam:   Blood pressure 112/78, pulse 75, height 5\' 10"  (1.778 m), weight 232 lb 3.2 oz (105.3 kg), SpO2 99 %.  General:  Well developed/well nourished in NAD Skin warm/dry Patient not depressed No peripheral clubbing Back-normal HEENT-normal/normal eyelids Neck supple/normal carotid upstroke bilaterally; no bruits; no JVD; no thyromegaly chest -  CTA/ normal expansion CV - RRR/normal S1 and S2; no murmurs, rubs or gallops;  PMI nondisplaced Abdomen -NT/ND, no HSM, no mass, + bowel sounds, no bruit 2+ femoral pulses, no bruits Ext-no edema, chords, 2+ DP Neuro-grossly nonfocal  ECG -normal sinus rhythm at a rate of 75, no ST changes.  Personally reviewed  A/P  1 bicuspid aortic valve/aortic stenosis-we will arrange follow-up echocardiogram.  2 thoracic aortic aneurysm-he will need follow-up CTA November 2023.  He is followed by thoracic surgery.  Kirk Ruths, MD

## 2021-12-29 ENCOUNTER — Encounter: Payer: Self-pay | Admitting: Cardiology

## 2021-12-29 ENCOUNTER — Ambulatory Visit: Payer: BC Managed Care – PPO | Admitting: Cardiology

## 2021-12-29 ENCOUNTER — Other Ambulatory Visit: Payer: Self-pay

## 2021-12-29 VITALS — BP 112/78 | HR 75 | Ht 70.0 in | Wt 232.2 lb

## 2021-12-29 DIAGNOSIS — I712 Thoracic aortic aneurysm, without rupture, unspecified: Secondary | ICD-10-CM

## 2021-12-29 DIAGNOSIS — Q231 Congenital insufficiency of aortic valve: Secondary | ICD-10-CM | POA: Diagnosis not present

## 2021-12-29 NOTE — Patient Instructions (Signed)

## 2022-01-07 DIAGNOSIS — Z125 Encounter for screening for malignant neoplasm of prostate: Secondary | ICD-10-CM | POA: Diagnosis not present

## 2022-01-07 DIAGNOSIS — N529 Male erectile dysfunction, unspecified: Secondary | ICD-10-CM | POA: Diagnosis not present

## 2022-01-07 DIAGNOSIS — Z0000 Encounter for general adult medical examination without abnormal findings: Secondary | ICD-10-CM | POA: Diagnosis not present

## 2022-01-07 DIAGNOSIS — R7301 Impaired fasting glucose: Secondary | ICD-10-CM | POA: Diagnosis not present

## 2022-01-11 ENCOUNTER — Ambulatory Visit (HOSPITAL_COMMUNITY): Payer: BC Managed Care – PPO | Attending: Internal Medicine

## 2022-01-11 ENCOUNTER — Other Ambulatory Visit: Payer: Self-pay

## 2022-01-11 DIAGNOSIS — Q231 Congenital insufficiency of aortic valve: Secondary | ICD-10-CM

## 2022-01-11 LAB — ECHOCARDIOGRAM COMPLETE
AR max vel: 2.38 cm2
AV Area VTI: 2.36 cm2
AV Area mean vel: 2.32 cm2
AV Mean grad: 12.5 mmHg
AV Peak grad: 21.1 mmHg
Ao pk vel: 2.3 m/s
Area-P 1/2: 3.17 cm2
S' Lateral: 4 cm

## 2022-01-13 DIAGNOSIS — Z0000 Encounter for general adult medical examination without abnormal findings: Secondary | ICD-10-CM | POA: Diagnosis not present

## 2022-01-13 DIAGNOSIS — Z1339 Encounter for screening examination for other mental health and behavioral disorders: Secondary | ICD-10-CM | POA: Diagnosis not present

## 2022-01-13 DIAGNOSIS — Z23 Encounter for immunization: Secondary | ICD-10-CM | POA: Diagnosis not present

## 2022-01-13 DIAGNOSIS — Z1331 Encounter for screening for depression: Secondary | ICD-10-CM | POA: Diagnosis not present

## 2022-01-13 DIAGNOSIS — R7301 Impaired fasting glucose: Secondary | ICD-10-CM | POA: Diagnosis not present

## 2022-01-14 DIAGNOSIS — D649 Anemia, unspecified: Secondary | ICD-10-CM | POA: Diagnosis not present

## 2022-02-25 DIAGNOSIS — L819 Disorder of pigmentation, unspecified: Secondary | ICD-10-CM | POA: Diagnosis not present

## 2022-02-25 DIAGNOSIS — L814 Other melanin hyperpigmentation: Secondary | ICD-10-CM | POA: Diagnosis not present

## 2022-02-25 DIAGNOSIS — L812 Freckles: Secondary | ICD-10-CM | POA: Diagnosis not present

## 2022-02-25 DIAGNOSIS — L821 Other seborrheic keratosis: Secondary | ICD-10-CM | POA: Diagnosis not present

## 2022-04-19 DIAGNOSIS — R7309 Other abnormal glucose: Secondary | ICD-10-CM | POA: Diagnosis not present

## 2022-05-20 DIAGNOSIS — R7309 Other abnormal glucose: Secondary | ICD-10-CM | POA: Diagnosis not present

## 2022-06-19 DIAGNOSIS — R7309 Other abnormal glucose: Secondary | ICD-10-CM | POA: Diagnosis not present

## 2022-07-20 DIAGNOSIS — R7309 Other abnormal glucose: Secondary | ICD-10-CM | POA: Diagnosis not present

## 2022-08-20 DIAGNOSIS — R7309 Other abnormal glucose: Secondary | ICD-10-CM | POA: Diagnosis not present

## 2022-09-19 DIAGNOSIS — R7309 Other abnormal glucose: Secondary | ICD-10-CM | POA: Diagnosis not present

## 2022-09-28 ENCOUNTER — Other Ambulatory Visit: Payer: Self-pay | Admitting: Surgery

## 2022-09-28 DIAGNOSIS — I7121 Aneurysm of the ascending aorta, without rupture: Secondary | ICD-10-CM

## 2022-10-20 DIAGNOSIS — R7309 Other abnormal glucose: Secondary | ICD-10-CM | POA: Diagnosis not present

## 2022-11-15 ENCOUNTER — Ambulatory Visit: Payer: BC Managed Care – PPO | Admitting: Physician Assistant

## 2022-11-15 ENCOUNTER — Ambulatory Visit
Admission: RE | Admit: 2022-11-15 | Discharge: 2022-11-15 | Disposition: A | Discharge status: Home / Self Care | Payer: BC Managed Care – PPO | Source: Ambulatory Visit | Attending: Surgery | Admitting: Surgery

## 2022-11-15 VITALS — BP 122/82 | HR 76 | Resp 20 | Ht 70.0 in | Wt 233.0 lb

## 2022-11-15 DIAGNOSIS — I712 Thoracic aortic aneurysm, without rupture, unspecified: Secondary | ICD-10-CM | POA: Diagnosis not present

## 2022-11-15 DIAGNOSIS — I7121 Aneurysm of the ascending aorta, without rupture: Secondary | ICD-10-CM | POA: Diagnosis not present

## 2022-11-15 DIAGNOSIS — Q230 Congenital stenosis of aortic valve: Secondary | ICD-10-CM | POA: Diagnosis not present

## 2022-11-15 DIAGNOSIS — Q231 Congenital insufficiency of aortic valve: Secondary | ICD-10-CM

## 2022-11-15 MED ORDER — IOPAMIDOL (ISOVUE-370) INJECTION 76%
75.0000 mL | Freq: Once | INTRAVENOUS | Status: AC | PRN
  Administered 2022-11-15: 75 mL via INTRAVENOUS

## 2022-11-15 NOTE — Progress Notes (Signed)
[address]       [place] [address]             [phone]        HPI: [name] is a 56 year old male with known past history of bicuspid aortic valve in ascending aortic dilation.  He has a positive family history for sudden death in both his father and grandfather.  He has been followed in our office for annual surveillance for the past few years.  In January of 2022 and November of 2022, his ascending aorta measured 4.4 - 4.5 cm and was stable compared to previous images.  He was seen by Dr. Jens Som last year for follow up of is bicuspid aortic valve and had a repeat ECHO showing mild to moderate aortic stenosis.  The most recent echocardiogram report "tricuspid aortic valve" the exam in November 2019 demonstrated a bicuspid aortic valve.  Today, [name] reports no changes in his health.  He feels well and has had no chest pain, shortness of breath, or concerns with circulation in his extremities.         Current Outpatient Medications  Medication Sig Dispense Refill   Cetirizine HCl 10 MG CAPS Take 1 tablet by mouth daily.     fluticasone (FLONASE) 50 MCG/ACT nasal spray Place 2 sprays into both nostrils daily.     sildenafil (REVATIO) 20 MG tablet TAKE 1-5 TABLETS BY MOUTH AS NEEDED  6   triprolidine-pseudoephedrine (APRODINE) 2.5-60 MG TABS tablet Take 1 tablet by mouth every 6 (six) hours as needed for allergies.     No current facility-administered medications for this visit.    Physical Exam: Vital signs BP 122/82 Pulse 76 Respirations 20 SPO2 100% on room air  General: [name] appears well.  He is in no distress. Heart: Regular rate and rhythm, I do not hear a murmur today. Chest: Symmetrical, breath sounds are clear to auscultation. Extremities: No peripheral edema   Diagnostic Tests: ECHOCARDIOGRAM REPORT       Patient Name:   Adrian Murphy Date of Exam: 01/11/2022  Medical Rec #:  [id number]       Height:       70.0  in  Accession #:    [id number]      Weight:       232.2 lb  Date of Birth:  Dec 03, 1966        BSA:          2.224 m  Patient Age:    56 years        BP:           112/78 mmHg  Patient Gender: M               HR:           78 bpm.  Exam Location:  [address]   Procedure: 2D Echo, 3D Echo, Cardiac Doppler, Color Doppler and Strain  Analysis   Indications:   Q23.1 Bicuspid aortic valve    History:        Patient has prior history of Echocardiogram examinations,  most                 recent 11/17/2018. Bicuspid aortic valve. Thoracic aortic                  aneurysm.    Sonographer:    Jorje Guild BS, RDCS  Referring Phys: [phone] BRIAN S CRENSHAW  IMPRESSIONS     1. Global longitudinal strain is -20.7%. Left ventricular ejection  fraction, by estimation, is 55 to 60%. The left ventricle has normal  function. The left ventricle has no regional wall motion abnormalities.  The left ventricular internal cavity size was   mildly dilated. Left ventricular diastolic parameters were normal.   2. RV Free wall strain -18.3%. Right ventricular systolic function is  normal. The right ventricular size is normal.   3. Trivial mitral valve regurgitation.   4. AV is thickened, calcified with restricted motion Peak and mean  gradients through the valve aer 23 and 14 mm Hg respectively AVA (VTI) is  2.2 cm2 . Dimensionless index is 0.41 consistent with mild to moderate AS.  Compared to previous study from 2018,   mean gradient increased from 9 to 14. . The aortic valve is tricuspid.  Aortic valve regurgitation is not visualized.   5. The inferior vena cava is normal in size with greater than 50%  respiratory variability, suggesting right atrial pressure of 3 mmHg.   Comparison(s): 11/17/18 EF 55-60%. AV mean PG, peak PG.   FINDINGS   Left Ventricle: Global longitudinal strain is -20.7%. Left ventricular  ejection fraction, by estimation, is 55 to 60%. The left ventricle has   normal function. The left ventricle has no regional wall motion  abnormalities. The left ventricular internal  cavity size was mildly dilated. There is no left ventricular hypertrophy.  Left ventricular diastolic parameters were normal.   Right Ventricle: RV Free wall strain -18.3%. The right ventricular size is  normal. Right vetricular wall thickness was not assessed. Right  ventricular systolic function is normal.   Left Atrium: Left atrial size was normal in size.   Right Atrium: Right atrial size was normal in size.   Pericardium: There is no evidence of pericardial effusion.   Mitral Valve: There is mild thickening of the mitral valve leaflet(s).  Trivial mitral valve regurgitation.   Tricuspid Valve: The tricuspid valve is normal in structure. Tricuspid  valve regurgitation is trivial.   Aortic Valve: AV is thickened, calcified with restricted motion Peak and  mean gradients through the valve aer 23 and 14 mm Hg respectively AVA  (VTI) is 2.2 cm2 . Dimensionless index is 0.41 consistent with mild to  moderate AS. Compared to previous study  from 2018, mean gradient increased from 9 to 14. The aortic valve is  tricuspid. Aortic valve regurgitation is not visualized. Aortic valve mean  gradient measures 12.5 mmHg. Aortic valve peak gradient measures 21.1  mmHg. Aortic valve area, by VTI measures   2.36 cm.   Pulmonic Valve: The pulmonic valve was not well visualized. Pulmonic valve  regurgitation is not visualized.   Aorta: The aortic root is normal in size and structure.   Venous: The inferior vena cava is normal in size with greater than 50%  respiratory variability, suggesting right atrial pressure of 3 mmHg.   IAS/Shunts: No atrial level shunt detected by color flow Doppler.     LEFT VENTRICLE  PLAX 2D  LVIDd:         5.17 cm   Diastology  LVIDs:         4.00 cm   LV e' medial:    9.68 cm/s  LV PW:         0.90 cm   LV E/e' medial:  7.2  LV IVS:        0.77  cm   LV e'  lateral:   12.60 cm/s  LVOT diam:     2.70 cm   LV E/e' lateral: 5.5  LV SV:         124  LV SV Index:   56        2D Longitudinal Strain  LVOT Area:     5.73 cm  2D Strain GLS (A2C):   -20.1 %                           2D Strain GLS (A3C):   -20.4 %                           2D Strain GLS (A4C):   -21.6 %                           2D Strain GLS Avg:     -20.7 %                             3D Volume EF:                           3D EF:        55 %                           LV EDV:       149 ml                           LV ESV:       67 ml                           LV SV:        82 ml   RIGHT VENTRICLE             IVC  RV Basal diam:  3.90 cm     IVC diam: 1.40 cm  RV S prime:     13.90 cm/s  TAPSE (M-mode): 2.4 cm   LEFT ATRIUM             Index        RIGHT ATRIUM           Index  LA diam:        2.30 cm 1.03 cm/m   RA Pressure: 3.00 mmHg  LA Vol (A2C):   33.0 ml 14.84 ml/m  RA Area:     16.20 cm  LA Vol (A4C):   25.4 ml 11.42 ml/m  RA Volume:   47.00 ml  21.13 ml/m  LA Biplane Vol: 30.0 ml 13.49 ml/m   AORTIC VALVE  AV Area (Vmax):    2.38 cm  AV Area (Vmean):   2.32 cm  AV Area (VTI):     2.36 cm  AV Vmax:           229.50 cm/s  AV Vmean:          165.500 cm/s  AV VTI:            0.523 m  AV Peak Grad:      21.1 mmHg  AV Mean Grad:      12.5 mmHg  LVOT Vmax:  95.40 cm/s  LVOT Vmean:        67.100 cm/s  LVOT VTI:          0.216 m  LVOT/AV VTI ratio: 0.41    AORTA  Ao Root diam: 3.90 cm  Ao Asc diam:  4.49 cm   MITRAL VALVE               TRICUSPID VALVE                             Estimated RAP:  3.00 mmHg  MV Decel Time: 239 msec  MV E velocity: 69.70 cm/s  SHUNTS  MV A velocity: 57.10 cm/s  Systemic VTI:  0.22 m  MV E/A ratio:  1.22        Systemic Diam: 2.70 cm   Dietrich PatesPaula Ross MD  Electronically signed by Dietrich PatesPaula Ross MD  Signature Date/Time: 01/11/2022/11:05:49 AM   CLINICAL DATA:  Aortic aneurysm.   EXAM: CT ANGIOGRAPHY CHEST WITH  CONTRAST   TECHNIQUE: Multidetector CT imaging of the chest was performed using the standard protocol during bolus administration of intravenous contrast. Multiplanar CT image reconstructions and MIPs were obtained to evaluate the vascular anatomy.   RADIATION DOSE REDUCTION: This exam was performed according to the departmental dose-optimization program which includes automated exposure control, adjustment of the mA and/or kV according to patient size and/or use of iterative reconstruction technique.   CONTRAST:  75mL ISOVUE-370 IOPAMIDOL (ISOVUE-370) INJECTION 76%   COMPARISON:  11/04/2021.   FINDINGS: Cardiovascular: Ascending aorta measures up to 4.3 cm (8/59), unchanged from 11/04/2021. Heart size normal. No pericardial effusion.   Mediastinum/Nodes: Subcentimeter low-attenuation lesions in the thyroid. No follow-up recommended. (Ref: J Am Coll Radiol. 2015 Feb;12(2): 143-50).No pathologically enlarged mediastinal, hilar or axillary lymph nodes. Esophagus is grossly unremarkable.   Lungs/Pleura: No suspicious pulmonary nodules. No pleural fluid. Airway is unremarkable.   Upper Abdomen: Visualized portions of the liver, gallbladder and adrenal glands are unremarkable. Scarring in the upper pole right kidney. Visualized portions of the kidneys, spleen, pancreas, stomach and bowel are otherwise unremarkable. Mesenteric lymph nodes are not enlarged by CT size criteria.   Musculoskeletal: Degenerative changes in the spine. No worrisome lytic or sclerotic lesions.   Review of the MIP images confirms the above findings.   IMPRESSION: 4.3 cm ascending aortic aneurysm, stable. Recommend annual imaging followup by CTA or MRA. This recommendation follows 2010 ACCF/AHA/AATS/ACR/ASA/SCA/SCAI/SIR/STS/SVM Guidelines for the Diagnosis and Management of Patients with Thoracic Aortic Disease. Circulation. 2010; 121: O130-Q657: E266-e369. Aortic aneurysm NOS (ICD10-I71.9).     Electronically  Signed   By: Leanna BattlesMelinda  Blietz M.D.   On: 11/15/2022 13:34     Impression / Plan: Stable thoracic aortic aneurysm measured at 4.3 cm by today's CTA.  Annual follow-up was recommended.  He was shown to have a bicuspid aortic valve by echo in 2019.  The echo earlier this year demonstrated mild to moderate aortic stenosis with aortic leaflet calcification.  This is being followed by Dr. Jens Somrenshaw. Blood pressure is well controlled without medications. We reviewed the importance of blood pressure management, avoidance of repetitive strenuous activity, and the importance of avoiding fluoroquinolone antibiotics.  Follow-up in 1 year with CTA  Adrian RocaMyron G. Bryna Razavi, PA-C [facility] [phone]

## 2022-11-15 NOTE — Patient Instructions (Signed)
Continue to follow guidelines as previously discussed to avoid strenuous repetitive activities.  Avoid fluoroquinolone antibiotics.  Follow-up in 1 year with CTA chest

## 2022-11-19 DIAGNOSIS — R7309 Other abnormal glucose: Secondary | ICD-10-CM | POA: Diagnosis not present

## 2022-12-20 DIAGNOSIS — R7309 Other abnormal glucose: Secondary | ICD-10-CM | POA: Diagnosis not present

## 2023-01-13 DIAGNOSIS — Z125 Encounter for screening for malignant neoplasm of prostate: Secondary | ICD-10-CM | POA: Diagnosis not present

## 2023-01-13 DIAGNOSIS — R7301 Impaired fasting glucose: Secondary | ICD-10-CM | POA: Diagnosis not present

## 2023-01-14 DIAGNOSIS — D649 Anemia, unspecified: Secondary | ICD-10-CM | POA: Diagnosis not present

## 2023-01-20 DIAGNOSIS — I7121 Aneurysm of the ascending aorta, without rupture: Secondary | ICD-10-CM | POA: Diagnosis not present

## 2023-01-20 DIAGNOSIS — Z23 Encounter for immunization: Secondary | ICD-10-CM | POA: Diagnosis not present

## 2023-01-20 DIAGNOSIS — R82998 Other abnormal findings in urine: Secondary | ICD-10-CM | POA: Diagnosis not present

## 2023-01-20 DIAGNOSIS — Z1331 Encounter for screening for depression: Secondary | ICD-10-CM | POA: Diagnosis not present

## 2023-01-20 DIAGNOSIS — Z0000 Encounter for general adult medical examination without abnormal findings: Secondary | ICD-10-CM | POA: Diagnosis not present

## 2023-01-20 DIAGNOSIS — Z1339 Encounter for screening examination for other mental health and behavioral disorders: Secondary | ICD-10-CM | POA: Diagnosis not present

## 2023-01-20 DIAGNOSIS — R7309 Other abnormal glucose: Secondary | ICD-10-CM | POA: Diagnosis not present

## 2023-02-18 DIAGNOSIS — R7309 Other abnormal glucose: Secondary | ICD-10-CM | POA: Diagnosis not present

## 2023-02-28 DIAGNOSIS — L728 Other follicular cysts of the skin and subcutaneous tissue: Secondary | ICD-10-CM | POA: Diagnosis not present

## 2023-02-28 DIAGNOSIS — L821 Other seborrheic keratosis: Secondary | ICD-10-CM | POA: Diagnosis not present

## 2023-02-28 DIAGNOSIS — L814 Other melanin hyperpigmentation: Secondary | ICD-10-CM | POA: Diagnosis not present

## 2023-02-28 DIAGNOSIS — D225 Melanocytic nevi of trunk: Secondary | ICD-10-CM | POA: Diagnosis not present

## 2023-03-21 DIAGNOSIS — R7309 Other abnormal glucose: Secondary | ICD-10-CM | POA: Diagnosis not present

## 2023-03-30 NOTE — Progress Notes (Signed)
HPI: FU bicuspid aortic valve, aortic stenosis and thoracic aortic aneurysm. CT 6/18; calcium score 0; thoracic aortic aneurysm 4.5 cm. Echo 11/19 showed normal LV function, mild left ventricular hypertrophy, bicuspid aortic valve with mild aortic stenosis (mean gradient 11 mmHg), dilated ascending aorta at 45 mm. Last echocardiogram January 2023 showed normal LV function, mild left ventricular enlargement, mild aortic stenosis with mean gradient 14 mmHg.  CTA November 2023 showed 4.3 cm ascending aortic aneurysm.  Since last seen, the patient denies any dyspnea on exertion, orthopnea, PND, pedal edema, palpitations, syncope or chest pain.   Current Outpatient Medications  Medication Sig Dispense Refill   Cetirizine HCl 10 MG CAPS Take 1 tablet by mouth daily.     fluticasone (FLONASE) 50 MCG/ACT nasal spray Place 2 sprays into both nostrils daily.     sildenafil (REVATIO) 20 MG tablet TAKE 1-5 TABLETS BY MOUTH AS NEEDED  6   triprolidine-pseudoephedrine (APRODINE) 2.5-60 MG TABS tablet Take 1 tablet by mouth every 6 (six) hours as needed for allergies.     No current facility-administered medications for this visit.     Past Medical History:  Diagnosis Date   Allergy    Aortic stenosis    Bicuspid aortic valve    Thoracic aortic aneurysm     Past Surgical History:  Procedure Laterality Date   CERVICAL DISC SURGERY     x3   SIGMOIDOSCOPY      Social History   Socioeconomic History   Marital status: Married    Spouse name: Not on file   Number of children: 4   Years of education: Not on file   Highest education level: Not on file  Occupational History   Not on file  Tobacco Use   Smoking status: Never   Smokeless tobacco: Never  Vaping Use   Vaping Use: Never used  Substance and Sexual Activity   Alcohol use: No   Drug use: No   Sexual activity: Yes  Other Topics Concern   Not on file  Social History Narrative   Not on file   Social Determinants of Health    Financial Resource Strain: Not on file  Food Insecurity: Not on file  Transportation Needs: Not on file  Physical Activity: Not on file  Stress: Not on file  Social Connections: Not on file  Intimate Partner Violence: Not on file    Family History  Problem Relation Age of Onset   Alzheimer's disease Mother    CAD Father    Stroke Paternal Grandmother    Heart disease Paternal Grandfather    Ovarian cancer Maternal Grandmother    Colon cancer Neg Hx    Esophageal cancer Neg Hx    Stomach cancer Neg Hx    Rectal cancer Neg Hx     ROS: no fevers or chills, productive cough, hemoptysis, dysphasia, odynophagia, melena, hematochezia, dysuria, hematuria, rash, seizure activity, orthopnea, PND, pedal edema, claudication. Remaining systems are negative.  Physical Exam: Well-developed well-nourished in no acute distress.  Skin is warm and dry.  HEENT is normal.  Neck is supple.  Chest is clear to auscultation with normal expansion.  Cardiovascular exam is regular rate and rhythm.  Abdominal exam nontender or distended. No masses palpated. Extremities show no edema. neuro grossly intact  ECG-normal sinus rhythm at a rate of 72, no ST changes.  Personally reviewed  A/P  1 history of bicuspid aortic valve/aortic stenosis-we will arrange follow-up echocardiogram.  Patient understands he will likely  require aortic valve replacement in the future.  2 thoracic aortic aneurysm-plan follow-up CTA November 2024.  Patient is also followed by Dr. Laneta Simmers.  Olga Millers, MD

## 2023-04-12 ENCOUNTER — Encounter: Payer: Self-pay | Admitting: Cardiology

## 2023-04-12 ENCOUNTER — Ambulatory Visit: Payer: BC Managed Care – PPO | Attending: Cardiology | Admitting: Cardiology

## 2023-04-12 VITALS — BP 124/74 | HR 72 | Ht 69.0 in | Wt 230.4 lb

## 2023-04-12 DIAGNOSIS — I712 Thoracic aortic aneurysm, without rupture, unspecified: Secondary | ICD-10-CM

## 2023-04-12 DIAGNOSIS — Q231 Congenital insufficiency of aortic valve: Secondary | ICD-10-CM

## 2023-04-12 NOTE — Patient Instructions (Signed)
  Testing/Procedures:  Your physician has requested that you have an echocardiogram. Echocardiography is a painless test that uses sound waves to create images of your heart. It provides your doctor with information about the size and shape of your heart and how well your heart's chambers and valves are working. This procedure takes approximately one hour. There are no restrictions for this procedure. Please do NOT wear cologne, perfume, aftershave, or lotions (deodorant is allowed). Please arrive 15 minutes prior to your appointment time. [address]   Follow-Up: At [facility], you and your health needs are our priority.  As part of our continuing mission to provide you with exceptional heart care, we have created designated Provider Care Teams.  These Care Teams include your primary Cardiologist (physician) and Advanced Practice Providers (APPs -  Physician Assistants and Nurse Practitioners) who all work together to provide you with the care you need, when you need it.  We recommend signing up for the patient portal called "MyChart".  Sign up information is provided on this After Visit Summary.  MyChart is used to connect with patients for Virtual Visits (Telemedicine).  Patients are able to view lab/test results, encounter notes, upcoming appointments, etc.  Non-urgent messages can be sent to your provider as well.   To learn more about what you can do with MyChart, go to [email or web address].    Your next appointment:   12 month(s)  Provider:   BRIAN CRENSHAW MD    

## 2023-04-20 DIAGNOSIS — R7309 Other abnormal glucose: Secondary | ICD-10-CM | POA: Diagnosis not present

## 2023-05-10 ENCOUNTER — Ambulatory Visit (HOSPITAL_COMMUNITY): Payer: BC Managed Care – PPO | Attending: Cardiology

## 2023-05-10 DIAGNOSIS — Q231 Congenital insufficiency of aortic valve: Secondary | ICD-10-CM | POA: Insufficient documentation

## 2023-05-10 LAB — ECHOCARDIOGRAM COMPLETE
AR max vel: 1.24 cm2
AV Area VTI: 1.24 cm2
AV Area mean vel: 1.19 cm2
AV Mean grad: 13.2 mmHg
AV Peak grad: 21.4 mmHg
Ao pk vel: 2.31 m/s
Area-P 1/2: 2.95 cm2
S' Lateral: 3.9 cm

## 2023-05-21 DIAGNOSIS — R7309 Other abnormal glucose: Secondary | ICD-10-CM | POA: Diagnosis not present

## 2023-06-20 DIAGNOSIS — R7309 Other abnormal glucose: Secondary | ICD-10-CM | POA: Diagnosis not present

## 2023-07-21 DIAGNOSIS — R7309 Other abnormal glucose: Secondary | ICD-10-CM | POA: Diagnosis not present

## 2023-08-21 DIAGNOSIS — R7309 Other abnormal glucose: Secondary | ICD-10-CM | POA: Diagnosis not present

## 2023-09-20 DIAGNOSIS — R7309 Other abnormal glucose: Secondary | ICD-10-CM | POA: Diagnosis not present

## 2023-09-27 ENCOUNTER — Other Ambulatory Visit: Payer: Self-pay | Admitting: Surgery

## 2023-09-27 DIAGNOSIS — I7121 Aneurysm of the ascending aorta, without rupture: Secondary | ICD-10-CM

## 2023-10-11 ENCOUNTER — Encounter: Payer: Self-pay | Admitting: Surgery

## 2023-10-21 DIAGNOSIS — R7309 Other abnormal glucose: Secondary | ICD-10-CM | POA: Diagnosis not present

## 2023-11-09 ENCOUNTER — Other Ambulatory Visit: Payer: BC Managed Care – PPO

## 2023-11-09 ENCOUNTER — Ambulatory Visit
Admission: RE | Admit: 2023-11-09 | Discharge: 2023-11-09 | Disposition: A | Discharge status: Home / Self Care | Payer: BC Managed Care – PPO | Source: Ambulatory Visit | Attending: Surgery | Admitting: Surgery

## 2023-11-09 DIAGNOSIS — I7121 Aneurysm of the ascending aorta, without rupture: Secondary | ICD-10-CM

## 2023-11-09 MED ORDER — IOPAMIDOL (ISOVUE-370) INJECTION 76%
500.0000 mL | Freq: Once | INTRAVENOUS | Status: AC | PRN
  Administered 2023-11-09: 75 mL via INTRAVENOUS

## 2023-11-11 NOTE — Progress Notes (Unsigned)
[address]       [place] [address]             [phone]    PCP is Cleatis Polka., MD Referring Provider is Cleatis Polka., MD  Chief Complaint: Ascending thoracic aortic aneurysm   HPI: This is a 57 year old male with a past medical history of a bicuspid aortic valve and ascending thoracic aortic aneurysm. He has a positive family history for sudden death in both his and grandfather. He has been followed in our office for annual surveillance for the past few years. He was last seen by my colleague in November 2023 and at that time the ATAA was 4.3 cm. He denies chest pain, pressure, or tightness.  Past Medical History:  Diagnosis Date   Allergy    Aortic stenosis    Bicuspid aortic valve    Thoracic aortic aneurysm [facility])     Past Surgical History:  Procedure Laterality Date   CERVICAL DISC SURGERY     x3   SIGMOIDOSCOPY      Family History  Problem Relation Age of Onset   Alzheimer's disease Mother    CAD Father    Stroke Paternal Grandmother    Heart disease Paternal Grandfather    Ovarian cancer Maternal Grandmother    Colon cancer Neg Hx    Esophageal cancer Neg Hx    Stomach cancer Neg Hx    Rectal cancer Neg Hx     Social History Social History   Tobacco Use   Smoking status: Never   Smokeless tobacco: Never  Vaping Use   Vaping status: Never Used  Substance Use Topics   Alcohol use: No   Drug use: No    Current Outpatient Medications  Medication Sig Dispense Refill   Cetirizine HCl 10 MG CAPS Take 1 tablet by mouth daily.     fluticasone (FLONASE) 50 MCG/ACT nasal spray Place 2 sprays into both nostrils daily.     sildenafil (REVATIO) 20 MG tablet TAKE 1-5 TABLETS BY MOUTH AS NEEDED  6   triprolidine-pseudoephedrine (APRODINE) 2.5-60 MG TABS tablet Take 1 tablet by mouth every 6 (six) hours as needed for allergies.     Allergies  Allergen Reactions   Pollen Extract Other (See Comments)    Runny nose,  Itchy Eyes   Quinolones     Patient was warned about not using Cipro and similar antibiotics. Recent studies have raised concern that fluoroquinolone antibiotics could be associated with an increased risk of aortic aneurysm Fluoroquinolones have non-antimicrobial properties that might jeopardise the integrity of the extracellular matrix of the vascular wall In a  propensity score matched cohort study in [place], there was a 66% increased rate of aortic aneurysm or dissection associated with oral fluoroquinolone use, compared wit    Review of Systems  Chest Pain [  ] Resting SOB [ ]  Exertional SOB [  ]  Pedal Edema [  ] Syncope [  ] Presyncope [  ]  General Review of Systems: [Y] = yes [ N]=no  Consitutional:   nausea [ ] ;  fever [ ] ;  Eye : blurred vision [ ] ; Amaurosis fugax[  ];  Resp: cough [ ] ;  hemoptysis[ ] ;  GI: vomiting[ ] ; melena[ ] ; hematochezia [] ;  [id number] ; Musculoskeletal: myalgias[ ] ; joint swelling[  ]; joint erythema[ ] ;  Heme/Lymph: anemia[ ] ;  Neuro: TIA[ ] ;stroke[ ] ;  seizures[ ] ;  Endocrine: diabetes[ ] ;   Vital  Signs:  Physical Exam CV Neck Pulmonary Abdomen Extremities Neurologic   Diagnostic Tests: Narrative & Impression  CLINICAL DATA:  Aortic aneurysm suspected aneurysm of ascending aorta   EXAM: CT ANGIOGRAPHY CHEST WITH CONTRAST   TECHNIQUE: Multidetector CT imaging of the chest was performed using the standard protocol during bolus administration of intravenous contrast. Multiplanar CT image reconstructions and MIPs were obtained to evaluate the vascular anatomy.   RADIATION DOSE REDUCTION: This exam was performed according to the departmental dose-optimization program which includes automated exposure control, adjustment of the mA and/or kV according to patient size and/or use of iterative reconstruction technique.   CONTRAST:  75mL ISOVUE-370 IOPAMIDOL (ISOVUE-370) INJECTION 76%   COMPARISON:  11/15/2022    FINDINGS: Cardiovascular: 4.5 cm ascending thoracic aortic aneurysm, previously measuring 4.3 cm. No evidence of aortic dissection.   The heart is unremarkable without pericardial effusion. Calcifications of the aortic valve again noted. No evidence of pulmonary embolus.   Mediastinum/Nodes: No enlarged mediastinal, hilar, or axillary lymph nodes. Thyroid gland, trachea, and esophagus are stable.   Lungs/Pleura: No acute airspace disease, effusion, or pneumothorax.   Upper Abdomen: No acute abnormality.   Musculoskeletal: No acute or destructive bony abnormalities. Reconstructed images demonstrate no additional findings.   Review of the MIP images confirms the above findings.   IMPRESSION: 1. 4.5 cm ascending thoracic aortic aneurysm. Recommend semi-annual imaging followup by CTA or MRA and referral to cardiothoracic surgery if not already obtained. This recommendation follows 2010 ACCF/AHA/AATS/ACR/ASA/SCA/SCAI/SIR/STS/SVM Guidelines for the Diagnosis and Management of Patients With Thoracic Aortic Disease. Circulation. 2010; 121: W295-A213. Aortic aneurysm NOS (ICD10-I71.9)     Electronically Signed   By: Sharlet Salina M.D.   On: 11/09/2023 18:24   Risk Modification in those with ascending thoracic aortic aneurysm:  Continue good control of blood pressure (prefer SBP 130/80 or less)  2. Avoid fluoroquinolone antibiotics (I.e Ciprofloxacin, Avelox, Levofloxacin, Ofloxacin)  3.  Use of statin (to decrease cardiovascular risk)-will defer to PCP whether or not needs  4.  Exercise and activity limitations is individualized, but in general, contact sports are to be  avoided and one should avoid heavy lifting (defined as half of ideal body weight) and exercises involving sustained Valsalva maneuver.  5. Counseling for those suspected of having genetically mediated disease. First-degree relatives of those with TAA disease should be screened as well as those who have a  connective tissue disease (I.e with Marfan syndrome, Ehlers-Danlos syndrome,  and Loeys-Dietz syndrome) or a  bicuspid aortic valve,have an increased risk for  complications related to TAA. He has no family history of connective tissue disease. Echo done May 2024 showed bicuspid aortic valve, mild aortic stenosis, and moderate dilatation of the ascending aorta, measuring 48 mm.   6. He has no history of tobacco abuse   Impression and Plan: CTA with a 4.5 cm ascending aortic aneurysm.  Echocardiogram shows a bicuspid aortic valve without evidence of regurgitation and mild aortic stenosis.  We discussed the natural history and and risk factors for growth of ascending aortic aneurysms.  We covered the importance of smoking cessation, tight blood pressure control, refraining from lifting heavy objects, and avoiding fluoroquinolones.  The patient is aware of signs and symptoms of aortic dissection and when to present to the emergency department.  We will continue surveillance and a repeat CTA was ordered for 6 months.     Ardelle Balls, PA-C [facility] [phone]

## 2023-11-16 ENCOUNTER — Ambulatory Visit: Payer: BC Managed Care – PPO | Admitting: Physician Assistant

## 2023-11-16 ENCOUNTER — Encounter: Payer: Self-pay | Admitting: Physician Assistant

## 2023-11-16 VITALS — BP 121/78 | HR 90 | Resp 18 | Ht 69.0 in | Wt 234.0 lb

## 2023-11-16 DIAGNOSIS — I7121 Aneurysm of the ascending aorta, without rupture: Secondary | ICD-10-CM

## 2023-11-16 NOTE — Patient Instructions (Addendum)
Risk Modification in those with ascending thoracic aortic aneurysm:  Continue good control of blood pressure (prefer SBP 130/80 or less)  2. Avoid fluoroquinolone antibiotics (I.e Ciprofloxacin, Avelox, Levofloxacin, Ofloxacin)  3.  Use of statin (to decrease cardiovascular risk)-will defer to PCP whether or not needs  4.  Exercise and activity limitations is individualized, but in general, contact sports are to be avoided and one should avoid heavy lifting (defined as half of ideal body weight) and exercises involving sustained Valsalva maneuver.  5. Counseling for those suspected of having genetically mediated disease. First-degree relatives of those with TAA disease should be screened as well as those who have a connective tissue disease (I.e with Marfan syndrome, Ehlers-Danlos syndrome, and Loeys-Dietz syndrome) or a  bicuspid aortic valve,have an increased risk for  complications related to TAA. He has no family history of connective tissue disease. Echo done May 2024 showed bicuspid aortic valve, mild aortic stenosis, and moderate dilatation of the ascending aorta, measuring 48 mm.   6. He has no history of tobacco abuse

## 2023-11-20 DIAGNOSIS — R7309 Other abnormal glucose: Secondary | ICD-10-CM | POA: Diagnosis not present

## 2023-12-21 DIAGNOSIS — R7309 Other abnormal glucose: Secondary | ICD-10-CM | POA: Diagnosis not present

## 2024-01-19 DIAGNOSIS — D509 Iron deficiency anemia, unspecified: Secondary | ICD-10-CM | POA: Diagnosis not present

## 2024-01-19 DIAGNOSIS — R7989 Other specified abnormal findings of blood chemistry: Secondary | ICD-10-CM | POA: Diagnosis not present

## 2024-01-19 DIAGNOSIS — Z125 Encounter for screening for malignant neoplasm of prostate: Secondary | ICD-10-CM | POA: Diagnosis not present

## 2024-01-19 DIAGNOSIS — Z0000 Encounter for general adult medical examination without abnormal findings: Secondary | ICD-10-CM | POA: Diagnosis not present

## 2024-01-21 DIAGNOSIS — R7309 Other abnormal glucose: Secondary | ICD-10-CM | POA: Diagnosis not present

## 2024-01-26 DIAGNOSIS — Z0000 Encounter for general adult medical examination without abnormal findings: Secondary | ICD-10-CM | POA: Diagnosis not present

## 2024-01-26 DIAGNOSIS — I7121 Aneurysm of the ascending aorta, without rupture: Secondary | ICD-10-CM | POA: Diagnosis not present

## 2024-01-26 DIAGNOSIS — R7301 Impaired fasting glucose: Secondary | ICD-10-CM | POA: Diagnosis not present

## 2024-01-26 DIAGNOSIS — Z1331 Encounter for screening for depression: Secondary | ICD-10-CM | POA: Diagnosis not present

## 2024-01-26 DIAGNOSIS — Z1339 Encounter for screening examination for other mental health and behavioral disorders: Secondary | ICD-10-CM | POA: Diagnosis not present

## 2024-02-18 DIAGNOSIS — R7309 Other abnormal glucose: Secondary | ICD-10-CM | POA: Diagnosis not present

## 2024-03-05 DIAGNOSIS — L814 Other melanin hyperpigmentation: Secondary | ICD-10-CM | POA: Diagnosis not present

## 2024-03-05 DIAGNOSIS — L728 Other follicular cysts of the skin and subcutaneous tissue: Secondary | ICD-10-CM | POA: Diagnosis not present

## 2024-03-05 DIAGNOSIS — L570 Actinic keratosis: Secondary | ICD-10-CM | POA: Diagnosis not present

## 2024-03-05 DIAGNOSIS — D492 Neoplasm of unspecified behavior of bone, soft tissue, and skin: Secondary | ICD-10-CM | POA: Diagnosis not present

## 2024-03-05 DIAGNOSIS — C44319 Basal cell carcinoma of skin of other parts of face: Secondary | ICD-10-CM | POA: Diagnosis not present

## 2024-03-05 DIAGNOSIS — L821 Other seborrheic keratosis: Secondary | ICD-10-CM | POA: Diagnosis not present

## 2024-03-05 DIAGNOSIS — D225 Melanocytic nevi of trunk: Secondary | ICD-10-CM | POA: Diagnosis not present

## 2024-03-20 DIAGNOSIS — R7309 Other abnormal glucose: Secondary | ICD-10-CM | POA: Diagnosis not present

## 2024-03-21 DIAGNOSIS — H6123 Impacted cerumen, bilateral: Secondary | ICD-10-CM | POA: Diagnosis not present

## 2024-03-21 DIAGNOSIS — H9193 Unspecified hearing loss, bilateral: Secondary | ICD-10-CM | POA: Diagnosis not present

## 2024-03-23 ENCOUNTER — Other Ambulatory Visit: Payer: Self-pay | Admitting: *Deleted

## 2024-03-23 DIAGNOSIS — Q2381 Bicuspid aortic valve: Secondary | ICD-10-CM

## 2024-03-23 DIAGNOSIS — I712 Thoracic aortic aneurysm, without rupture, unspecified: Secondary | ICD-10-CM

## 2024-03-30 ENCOUNTER — Other Ambulatory Visit: Payer: Self-pay | Admitting: Surgery

## 2024-03-30 DIAGNOSIS — I7121 Aneurysm of the ascending aorta, without rupture: Secondary | ICD-10-CM

## 2024-04-04 DIAGNOSIS — M25561 Pain in right knee: Secondary | ICD-10-CM | POA: Diagnosis not present

## 2024-04-09 DIAGNOSIS — C44319 Basal cell carcinoma of skin of other parts of face: Secondary | ICD-10-CM | POA: Diagnosis not present

## 2024-04-19 DIAGNOSIS — R7309 Other abnormal glucose: Secondary | ICD-10-CM | POA: Diagnosis not present

## 2024-05-03 ENCOUNTER — Encounter: Payer: Self-pay | Admitting: Surgery

## 2024-05-07 ENCOUNTER — Other Ambulatory Visit

## 2024-05-10 ENCOUNTER — Ambulatory Visit
Admission: RE | Admit: 2024-05-10 | Discharge: 2024-05-10 | Disposition: A | Discharge status: Home / Self Care | Source: Ambulatory Visit | Attending: Surgery | Admitting: Surgery

## 2024-05-10 DIAGNOSIS — L905 Scar conditions and fibrosis of skin: Secondary | ICD-10-CM | POA: Diagnosis not present

## 2024-05-10 DIAGNOSIS — I7121 Aneurysm of the ascending aorta, without rupture: Secondary | ICD-10-CM | POA: Diagnosis not present

## 2024-05-10 MED ORDER — IOPAMIDOL (ISOVUE-370) INJECTION 76%
75.0000 mL | Freq: Once | INTRAVENOUS | Status: AC | PRN
  Administered 2024-05-10: 75 mL via INTRAVENOUS

## 2024-05-15 ENCOUNTER — Ambulatory Visit

## 2024-05-16 ENCOUNTER — Ambulatory Visit (HOSPITAL_COMMUNITY)
Admission: RE | Admit: 2024-05-16 | Discharge: 2024-05-16 | Disposition: A | Discharge status: Home / Self Care | Source: Ambulatory Visit | Attending: Cardiovascular Disease | Admitting: Cardiovascular Disease

## 2024-05-16 DIAGNOSIS — Q2381 Bicuspid aortic valve: Secondary | ICD-10-CM | POA: Diagnosis not present

## 2024-05-16 DIAGNOSIS — I712 Thoracic aortic aneurysm, without rupture, unspecified: Secondary | ICD-10-CM | POA: Diagnosis not present

## 2024-05-16 LAB — ECHOCARDIOGRAM COMPLETE
AR max vel: 1.21 cm2
AV Area VTI: 1.32 cm2
AV Area mean vel: 1.22 cm2
AV Mean grad: 14 mmHg
AV Peak grad: 23.9 mmHg
Ao pk vel: 2.44 m/s
Area-P 1/2: 3.75 cm2
Est EF: 55
S' Lateral: 3.8 cm

## 2024-05-16 NOTE — Progress Notes (Signed)
 [address]       [place] [address]             [phone]        BASIM BARTNIK [id number] [birth year]  History of Present Illness: Mr. Mosqueda is a 58 year old male with a past medical history of aortic stenosis, bicuspid aortic valve and a thoracic aortic aneurysm. He was incidentally found to have a thoracic aneurysm measuring 4.5cm on coronary CT scan in June 2018 and echocardiogram showed a bicuspid aortic valve. He has been seen in our office since 2018 and the aneurysm has remained stable at 4.5cm  He has a family history of sudden death in his father and grandfather but no definitive family history of aortic aneurysm. Always described as a heart attack but no autopsy was performed. He denies personal history of connective tissue disorder.   Today he denies chest pain, chest tightness, shortness of breath, dizziness, and loss of consciousness.  He reports his biggest pain is of his right knee and he has an appointment at [facility] this week.  He remains active.   Current Outpatient Medications on File Prior to Visit  Medication Sig Dispense Refill   Cetirizine HCl 10 MG CAPS Take 1 tablet by mouth daily.     fluticasone (FLONASE) 50 MCG/ACT nasal spray Place 2 sprays into both nostrils daily.     sildenafil (REVATIO) 20 MG tablet TAKE 1-5 TABLETS BY MOUTH AS NEEDED  6   triprolidine-pseudoephedrine (APRODINE) 2.5-60 MG TABS tablet Take 1 tablet by mouth every 6 (six) hours as needed for allergies.     No current facility-administered medications on file prior to visit.   Vitals: Today's Vitals   05/22/24 1309  BP: 117/80  Pulse: 87  Resp: 20  SpO2: 94%  Weight: 235 lb 6.4 oz (106.8 kg)  Height: 5\' 9"  (1.753 m)   Body mass index is 34.76 kg/m.  Physical Exam General: Alert and oriented, no acute distress Neuro: Grossly intact CV: Regular rate and rhythm, no murmur Pulm: Clear to auscultation bilaterally GI: Nontender, no  distension Extremities: 2+ radial pulses bilaterally, no lower extremity edema  CTA Results: CLINICAL DATA:  Thoracic aortic aneurysm, surveillance   EXAM: CT ANGIOGRAPHY CHEST WITH CONTRAST   TECHNIQUE: Multidetector CT imaging of the chest was performed using the standard protocol during bolus administration of intravenous contrast. Multiplanar CT image reconstructions and MIPs were obtained to evaluate the vascular anatomy.   RADIATION DOSE REDUCTION: This exam was performed according to the departmental dose-optimization program which includes automated exposure control, adjustment of the mA and/or kV according to patient size and/or use of iterative reconstruction technique.   CONTRAST:  75mL ISOVUE -370 IOPAMIDOL  (ISOVUE -370) INJECTION 76%   COMPARISON:  Prior CTA of the chest 11/09/2023   FINDINGS: Cardiovascular: Conventional 3 vessel arch anatomy. Fusiform aneurysmal dilation of the ascending thoracic aorta remains unchanged with a maximal diameter of 4.5 cm. The aortic root is normal in caliber. The aortic valve is thickened and calcified. No evidence of dissection. The heart is normal in size. No pericardial effusion.   Mediastinum/Nodes: Small thyroid nodules are present, however none measure larger than 1.5 cm and therefore none meet criteria for further dedicated imaging. No mediastinal mass or adenopathy. Unremarkable esophagus.   Lungs/Pleura: Lungs are clear. No pleural effusion or pneumothorax.   Upper Abdomen: No acute abnormality.   Musculoskeletal: No chest wall abnormality. No acute or significant osseous findings.   Review  of the MIP images confirms the above findings.   IMPRESSION: 1. Thickened and calcified aortic valve likely reflecting underlying aortic valvular disease. 2. Stable mild fusiform aneurysmal dilation of the ascending thoracic aorta with a maximal diameter of 4.5 cm.   Aortic aneurysm NOS (ICD10-I71.9).     Electronically  Signed   By: Fernando Hoyer M.D.   On: 05/10/2024 09:00   Impression and Plan: ATAA: Mr. Forehand presents to the clinic with a stable 4.5 cm ascending aortic aneurysm.  Echocardiogram shows a bicuspid aortic valve with mild aortic stenosis and without evidence of regurgitation.  We discussed the natural history and and risk factors for growth of ascending aortic aneurysms. We covered the importance of tight blood pressure control, refraining from lifting heavy objects, and avoiding fluoroquinolones. The patient is aware of signs and symptoms of aortic dissection and when to present to the emergency department.  We will continue surveillance, plan to have the patient return to clinic with chest CTA in 6 months.   Risk Modification:  Statin:  No HLD  Smoking cessation instruction/counseling given:  nonsmoker  Patient was counseled on importance of Blood Pressure Control.  Despite Medical intervention if the patient notices persistently elevated blood pressure readings.  They are instructed to contact their Primary Care Physician  Please avoid use of Fluoroquinolones as this can potentially increase your risk of Aortic Rupture and/or Dissection  Patient educated on signs and symptoms of Aortic Dissection, handout also provided in AVS  Randa Burton, PA-C 05/16/24

## 2024-05-17 ENCOUNTER — Ambulatory Visit: Payer: Self-pay | Admitting: Cardiology

## 2024-05-17 DIAGNOSIS — Q2381 Bicuspid aortic valve: Secondary | ICD-10-CM

## 2024-05-17 DIAGNOSIS — I712 Thoracic aortic aneurysm, without rupture, unspecified: Secondary | ICD-10-CM

## 2024-05-20 DIAGNOSIS — R7309 Other abnormal glucose: Secondary | ICD-10-CM | POA: Diagnosis not present

## 2024-05-22 ENCOUNTER — Ambulatory Visit: Attending: Surgery | Admitting: Physician Assistant

## 2024-05-22 VITALS — BP 117/80 | HR 87 | Resp 20 | Ht 69.0 in | Wt 235.4 lb

## 2024-05-22 DIAGNOSIS — I7121 Aneurysm of the ascending aorta, without rupture: Secondary | ICD-10-CM

## 2024-05-22 DIAGNOSIS — I712 Thoracic aortic aneurysm, without rupture, unspecified: Secondary | ICD-10-CM | POA: Insufficient documentation

## 2024-05-22 NOTE — Patient Instructions (Signed)

## 2024-05-28 DIAGNOSIS — M7631 Iliotibial band syndrome, right leg: Secondary | ICD-10-CM | POA: Diagnosis not present

## 2024-06-19 DIAGNOSIS — R7309 Other abnormal glucose: Secondary | ICD-10-CM | POA: Diagnosis not present

## 2024-06-27 NOTE — Progress Notes (Signed)
 HPI: FU bicuspid aortic valve, aortic stenosis and thoracic aortic aneurysm. CT 6/18; calcium score 0; thoracic aortic aneurysm 4.5 cm. Echo 11/19 showed normal LV function, mild left ventricular hypertrophy, bicuspid aortic valve with mild aortic stenosis (mean gradient 11 mmHg), dilated ascending aorta at 45 mm. Most recent echocardiogram May 2025 showed normal LV function, bicuspid aortic valve with mild to moderate aortic stenosis (mean gradient 14 mmHg; aortic valve area 1.32 cm), moderately dilated ascending aorta at 46 mm.  CTA May 2025 showed stable aneurysmal dilatation of the ascending thoracic aorta at 4.5 cm.  Since last seen, patient denies dyspnea, exertional chest pain or syncope.  No palpitations.  Current Outpatient Medications  Medication Sig Dispense Refill   Cetirizine HCl 10 MG CAPS Take 1 tablet by mouth daily.     fluticasone (FLONASE) 50 MCG/ACT nasal spray Place 2 sprays into both nostrils daily.     tadalafil (CIALIS) 20 MG tablet 1 tablet as needed Orally daily as needed for 30 days     triprolidine-pseudoephedrine (APRODINE) 2.5-60 MG TABS tablet Take 1 tablet by mouth every 6 (six) hours as needed for allergies.     No current facility-administered medications for this visit.     Past Medical History:  Diagnosis Date   Allergy    Aortic stenosis    Bicuspid aortic valve    Thoracic aortic aneurysm [facility])     Past Surgical History:  Procedure Laterality Date   CERVICAL DISC SURGERY     x3   SIGMOIDOSCOPY      Social History   Socioeconomic History   Marital status: Married    Spouse name: Not on file   Number of children: 4   Years of education: Not on file   Highest education level: Not on file  Occupational History   Not on file  Tobacco Use   Smoking status: Never   Smokeless tobacco: Never  Vaping Use   Vaping status: Never Used  Substance and Sexual Activity   Alcohol  use: No   Drug use: No   Sexual activity: Yes  Other Topics  Concern   Not on file  Social History Narrative   Not on file   Social Drivers of Health   Financial Resource Strain: Not on file  Food Insecurity: Not on file  Transportation Needs: Not on file  Physical Activity: Not on file  Stress: Not on file  Social Connections: Not on file  Intimate Partner Violence: Not on file    Family History  Problem Relation Age of Onset   Alzheimer's disease Mother    CAD Father    Stroke Paternal Grandmother    Heart disease Paternal Grandfather    Ovarian cancer Maternal Grandmother    Colon cancer Neg Hx    Esophageal cancer Neg Hx    Stomach cancer Neg Hx    Rectal cancer Neg Hx     ROS: no fevers or chills, productive cough, hemoptysis, dysphasia, odynophagia, melena, hematochezia, dysuria, hematuria, rash, seizure activity, orthopnea, PND, pedal edema, claudication. Remaining systems are negative.  Physical Exam: Well-developed well-nourished in no acute distress.  Skin is warm and dry.  HEENT is normal.  Neck is supple.  Chest is clear to auscultation with normal expansion.  Cardiovascular exam is regular rate and rhythm.  Abdominal exam nontender or distended. No masses palpated. Extremities show no edema. neuro grossly intact  EKG Interpretation Date/Time:  Tuesday July 10 2024 08:03:48 EDT Ventricular Rate:  76 PR  Interval:  172 QRS Duration:  96 QT Interval:  386 QTC Calculation: 434 R Axis:   7  Text Interpretation: Sinus rhythm with occasional Premature ventricular complexes Confirmed by Pietro Rogue ([id number]) on 07/10/2024 8:04:26 AM    A/P  1 bicuspid aortic valve/aortic stenosis-mild to moderate AS on most recent echocardiogram.  Plan follow-up study May 2026.  Patient understands he will likely require aortic valve replacement in the future.  2 thoracic aortic aneurysm-patient will need follow-up CTA May 2026.  Most recent CTA showed ascending aorta aneurysm unchanged in size.  Rogue Pietro, MD

## 2024-07-10 ENCOUNTER — Encounter: Payer: Self-pay | Admitting: Cardiology

## 2024-07-10 ENCOUNTER — Ambulatory Visit: Attending: Cardiology | Admitting: Cardiology

## 2024-07-10 VITALS — BP 118/82 | HR 76 | Ht 70.0 in | Wt 234.0 lb

## 2024-07-10 DIAGNOSIS — Q2381 Bicuspid aortic valve: Secondary | ICD-10-CM | POA: Diagnosis not present

## 2024-07-10 DIAGNOSIS — I712 Thoracic aortic aneurysm, without rupture, unspecified: Secondary | ICD-10-CM | POA: Diagnosis not present

## 2024-07-10 NOTE — Patient Instructions (Signed)

## 2024-07-20 DIAGNOSIS — R7309 Other abnormal glucose: Secondary | ICD-10-CM | POA: Diagnosis not present

## 2024-08-20 DIAGNOSIS — R7309 Other abnormal glucose: Secondary | ICD-10-CM | POA: Diagnosis not present

## 2024-09-19 DIAGNOSIS — R7309 Other abnormal glucose: Secondary | ICD-10-CM | POA: Diagnosis not present

## 2024-10-20 DIAGNOSIS — R7309 Other abnormal glucose: Secondary | ICD-10-CM | POA: Diagnosis not present

## 2024-10-31 ENCOUNTER — Other Ambulatory Visit: Payer: Self-pay | Admitting: Surgery

## 2024-10-31 DIAGNOSIS — L814 Other melanin hyperpigmentation: Secondary | ICD-10-CM | POA: Diagnosis not present

## 2024-10-31 DIAGNOSIS — D225 Melanocytic nevi of trunk: Secondary | ICD-10-CM | POA: Diagnosis not present

## 2024-10-31 DIAGNOSIS — Q2381 Bicuspid aortic valve: Secondary | ICD-10-CM

## 2024-10-31 DIAGNOSIS — L821 Other seborrheic keratosis: Secondary | ICD-10-CM | POA: Diagnosis not present

## 2024-10-31 DIAGNOSIS — Z08 Encounter for follow-up examination after completed treatment for malignant neoplasm: Secondary | ICD-10-CM | POA: Diagnosis not present

## 2024-10-31 DIAGNOSIS — L570 Actinic keratosis: Secondary | ICD-10-CM | POA: Diagnosis not present

## 2024-10-31 DIAGNOSIS — I7121 Aneurysm of the ascending aorta, without rupture: Secondary | ICD-10-CM

## 2024-11-16 ENCOUNTER — Ambulatory Visit (HOSPITAL_COMMUNITY)
Admission: RE | Admit: 2024-11-16 | Discharge: 2024-11-16 | Disposition: A | Discharge status: Home / Self Care | Source: Ambulatory Visit | Attending: Surgery | Admitting: Surgery

## 2024-11-16 DIAGNOSIS — I7121 Aneurysm of the ascending aorta, without rupture: Secondary | ICD-10-CM | POA: Diagnosis not present

## 2024-11-16 DIAGNOSIS — Q2381 Bicuspid aortic valve: Secondary | ICD-10-CM | POA: Diagnosis not present

## 2024-11-16 DIAGNOSIS — I350 Nonrheumatic aortic (valve) stenosis: Secondary | ICD-10-CM | POA: Insufficient documentation

## 2024-11-16 MED ORDER — IOHEXOL 350 MG/ML SOLN
75.0000 mL | Freq: Once | INTRAVENOUS | Status: AC | PRN
  Administered 2024-11-16: 75 mL via INTRAVENOUS

## 2024-11-28 ENCOUNTER — Ambulatory Visit: Attending: Surgery | Admitting: Surgery

## 2024-11-28 ENCOUNTER — Encounter: Payer: Self-pay | Admitting: Surgery

## 2024-11-28 VITALS — BP 128/85 | HR 85 | Resp 18 | Ht 70.0 in | Wt 238.0 lb

## 2024-11-28 DIAGNOSIS — I7121 Aneurysm of the ascending aorta, without rupture: Secondary | ICD-10-CM | POA: Diagnosis not present

## 2024-11-29 NOTE — Progress Notes (Signed)
 [address], [place] [address]             [phone]    HPI:  Adrian Murphy is a 58 year old male with a past medical history of aortic stenosis, bicuspid aortic valve and a thoracic aortic aneurysm. He was incidentally found to have a thoracic aneurysm measuring 4.5 cm on coronary CT scan in June 2018 and echocardiogram showed a bicuspid aortic valve. He was followed by Dr. Army since 2018 until his retirement and then has seen one of our physician assistant since then. The aneurysm has remained stable at 4.5cm   He has a family history of sudden death in his father at a young age and grandfather but no definitive family history of aortic aneurysm. It was always described as a heart attack but no autopsy was performed. He denies personal history of connective tissue disorder.   His main complaints have been related to his right knee with arthritic pain that has been followed by orthopedic surgery.  He denies any chest pain or shortness of breath.  Current Outpatient Medications  Medication Sig Dispense Refill   Cetirizine HCl 10 MG CAPS Take 1 tablet by mouth daily.     fluticasone (FLONASE) 50 MCG/ACT nasal spray Place 2 sprays into both nostrils daily.     tadalafil (CIALIS) 20 MG tablet 1 tablet as needed Orally daily as needed for 30 days     triprolidine-pseudoephedrine (APRODINE) 2.5-60 MG TABS tablet Take 1 tablet by mouth every 6 (six) hours as needed for allergies.     No current facility-administered medications for this visit.     Physical Exam: BP 128/85 (BP Location: Left Arm)   Pulse 85   Resp 18   Ht 5' 10 (1.778 m)   Wt 238 lb (108 kg)   SpO2 99%   BMI 34.15 kg/m  He looks well. Cardiac exam shows a regular rate and rhythm with normal heart sounds.  There is no murmur. Lungs are clear. There is no peripheral edema.  Diagnostic Tests:   ECHOCARDIOGRAM REPORT       Patient Name:   Adrian Murphy Date of Exam:  05/16/2024 Medical Rec #:  [id number]       Height:       69.0 in Accession #:    [id number]      Weight:       234.0 lb Date of Birth:  01-13-1966        BSA:          2.209 m Patient Age:    58 years        BP:           121/78 mmHg Patient Gender: M               HR:           70 bpm. Exam Location:  [address]  Procedure: 2D Echo, 3D Echo, Cardiac Doppler, Color Doppler and Strain Analysis            (Both Spectral and Color Flow Doppler were utilized during            procedure).  Indications:    I35.0 Aortic Stenosis   History:        Patient has prior history of Echocardiogram examinations, most                 recent 05/10/2023. Risk Factors:Family History of  Coronary Artery                 Disease. History of Bicuspid Aortic Valve with Aortic Stenosis,                 Thoracic Aortic Aneurysm.   Sonographer:    Heather Hawks RDCS Referring Phys: REDELL RAMAN CRENSHAW  IMPRESSIONS    1. Left ventricular ejection fraction, by estimation, is 55%. The left ventricle has normal function. The left ventricle has no regional wall motion abnormalities. Left ventricular diastolic parameters were normal. The average left ventricular global longitudinal strain is -24.1 %.  2. Right ventricular systolic function is normal. The right ventricular size is normal. There is normal pulmonary artery systolic pressure. The estimated right ventricular systolic pressure is 22.0 mmHg.  3. The mitral valve is normal in structure. No evidence of mitral valve regurgitation. No evidence of mitral stenosis.  4. The aortic valve is bicuspid. There is moderate calcification of the aortic valve. Aortic valve regurgitation is not visualized. Mild to moderate aortic valve stenosis. Aortic valve area, by VTI measures 1.32 cm. Aortic valve mean gradient measures 14.0 mmHg.  5. Aortic dilatation noted. There is moderate dilatation of the ascending aorta, measuring 46 mm.  6. The inferior vena cava is  normal in size with greater than 50% respiratory variability, suggesting right atrial pressure of 3 mmHg.  FINDINGS  Left Ventricle: Left ventricular ejection fraction, by estimation, is 55%. The left ventricle has normal function. The left ventricle has no regional wall motion abnormalities. The average left ventricular global longitudinal strain is -24.1 %. The left  ventricular internal cavity size was normal in size. There is no left ventricular hypertrophy. Left ventricular diastolic parameters were normal.  Right Ventricle: The right ventricular size is normal. No increase in right ventricular wall thickness. Right ventricular systolic function is normal. There is normal pulmonary artery systolic pressure. The tricuspid regurgitant velocity is 2.18 m/s, and  with an assumed right atrial pressure of 3 mmHg, the estimated right ventricular systolic pressure is 22.0 mmHg.  Left Atrium: Left atrial size was normal in size.  Right Atrium: Right atrial size was normal in size.  Pericardium: There is no evidence of pericardial effusion.  Mitral Valve: The mitral valve is normal in structure. No evidence of mitral valve regurgitation. No evidence of mitral valve stenosis.  Tricuspid Valve: The tricuspid valve is normal in structure. Tricuspid valve regurgitation is trivial.  Aortic Valve: The aortic valve is bicuspid. There is moderate calcification of the aortic valve. Aortic valve regurgitation is not visualized. Mild to moderate aortic stenosis is present. Aortic valve mean gradient measures 14.0 mmHg. Aortic valve peak gradient measures 23.9 mmHg. Aortic valve area, by VTI measures 1.32 cm.  Pulmonic Valve: The pulmonic valve was normal in structure. Pulmonic valve regurgitation is not visualized.  Aorta: Aortic dilatation noted. There is moderate dilatation of the ascending aorta, measuring 46 mm.  Venous: The inferior vena cava is normal in size with greater than  50% respiratory variability, suggesting right atrial pressure of 3 mmHg.  IAS/Shunts: No atrial level shunt detected by color flow Doppler.  Additional Comments: 3D was performed not requiring image post processing on an independent workstation and was normal.    LEFT VENTRICLE PLAX 2D LVIDd:         5.10 cm   Diastology LVIDs:         3.80 cm   LV e' medial:    8.59 cm/s LV PW:  0.80 cm   LV E/e' medial:  8.8 LV IVS:        0.90 cm   LV e' lateral:   11.60 cm/s LVOT diam:     2.40 cm   LV E/e' lateral: 6.5 LV SV:         72 LV SV Index:   33        2D Longitudinal Strain LVOT Area:     4.52 cm  2D Strain GLS (A4C):   -29.9 %                          2D Strain GLS (A3C):   -21.8 %                          2D Strain GLS (A2C):   -20.5 %                          2D Strain GLS Avg:     -24.1 %                            3D Volume EF:                          3D EF:        61 %                          LV EDV:       149 ml                          LV ESV:       58 ml                          LV SV:        91 ml  RIGHT VENTRICLE RV Basal diam:  4.10 cm RV Mid diam:    3.00 cm RV S prime:     13.50 cm/s TAPSE (M-mode): 2.8 cm RVSP:           22.0 mmHg  LEFT ATRIUM             Index        RIGHT ATRIUM           Index LA diam:        3.30 cm 1.49 cm/m   RA Pressure: 3.00 mmHg LA Vol (A2C):   68.9 ml 31.20 ml/m  RA Area:     18.90 cm LA Vol (A4C):   37.3 ml 16.89 ml/m  RA Volume:   56.80 ml  25.72 ml/m LA Biplane Vol: 53.7 ml 24.31 ml/m  AORTIC VALVE AV Area (Vmax):    1.21 cm AV Area (Vmean):   1.22 cm AV Area (VTI):     1.32 cm AV Vmax:           244.33 cm/s AV Vmean:          169.333 cm/s AV VTI:            0.547 m AV Peak Grad:      23.9 mmHg AV Mean Grad:      14.0 mmHg LVOT Vmax:  65.10 cm/s LVOT Vmean:        45.700 cm/s LVOT VTI:          0.160 m LVOT/AV VTI ratio: 0.29   AORTA Ao Root diam: 3.00 cm Ao Asc diam:  4.60 cm  MITRAL VALVE                TRICUSPID VALVE MV Area (PHT): cm         TR Peak grad:   19.0 mmHg MV Decel Time: 203 msec    TR Vmax:        218.00 cm/s MV E velocity: 75.35 cm/s  Estimated RAP:  3.00 mmHg MV A velocity: 56.85 cm/s  RVSP:           22.0 mmHg MV E/A ratio:  1.33                            SHUNTS                            Systemic VTI:  0.16 m                            Systemic Diam: 2.40 cm  Dalton McleanMD Electronically signed by Ezra Kanner Signature Date/Time: 05/16/2024/1:25:13 PM       Final      Narrative & Impression  CLINICAL DATA:  Aneurysm of ascending thoracic aorta without rupture. Aortic stenosis due to bicuspid aortic valve.   EXAM: CT ANGIOGRAPHY CHEST WITH CONTRAST   TECHNIQUE: Multidetector CT imaging of the chest was performed using the standard protocol during bolus administration of intravenous contrast. Multiplanar CT image reconstructions and MIPs were obtained to evaluate the vascular anatomy.   RADIATION DOSE REDUCTION: This exam was performed according to the departmental dose-optimization program which includes automated exposure control, adjustment of the mA and/or kV according to patient size and/or use of iterative reconstruction technique.   CONTRAST:  75mL OMNIPAQUE  IOHEXOL  350 MG/ML SOLN   COMPARISON:  05/10/2024   FINDINGS: Cardiovascular: Aortic root measures 4.2 cm and stable. Sinotubular junction measures 3.5 cm. Fusiform aneurysm of the ascending thoracic aorta measures 4.6 cm and stable. Negative for an aortic dissection. Aortic arch great vessels are patent. Proximal descending thoracic aorta measures 2.7 cm and stable. Heart size normal. No significant pericardial effusion. Visualized visceral arteries in the abdomen are patent. Main pulmonary arteries are patent. Calcifications at the aortic valve.   Mediastinum/Nodes: No mediastinal or hilar lymph node enlargement. No significant axillary lymph node enlargement.  Esophagus is unremarkable.   Lungs/Pleura: Trachea and mainstem bronchi are patent. No significant airspace disease or lung consolidation. No pleural effusions. Stable triangular-shaped nodular density along the right minor fissure image 61, sequence 302 measures 5 mm. No suspicious pulmonary nodules. One or two punctate nodules near the base of the lingula appear to be stable.   Upper Abdomen: Images of the upper abdomen are unremarkable.   Musculoskeletal: Partially imaged surgical plate in the lower cervical spine. No acute bone abnormality.   Review of the MIP images confirms the above findings.   IMPRESSION: 1. Stable fusiform aneurysm of the ascending thoracic aorta measuring up to 4.6 cm. Recommend semi-annual imaging followup by CTA or MRA and referral to cardiothoracic surgery if not already obtained. This recommendation follows 2010 ACCF/AHA/AATS/ACR/ASA/SCA/SCAI/SIR/STS/SVM Guidelines for the Diagnosis and Management of Patients With Thoracic  Aortic Disease. Circulation. 2010; 121: [id number]. Aortic aneurysm NOS (ICD10-I71.9) 2. Aortic valve calcifications. 3. No acute chest abnormality.     Electronically Signed   By: Juliene Balder M.D.   On: 11/16/2024 09:26      Impression:  This 58 year old gentleman has a bicuspid aortic valve with moderate calcification and a mean gradient of 14 mmHg consistent with mild to moderate aortic stenosis.  His ascending aorta was measured at 4.6 cm on echo and CTA.  He does not require intervention at this time for his aortic valve stenosis or his aortic aneurysm but will require yearly follow-up of both.  I reviewed the CT and echo images with him and answered all of his questions.  I discussed the signs and symptoms of progressive aortic stenosis with him. His aneurysm is still well below the surgical threshold of 5.0 cm in patients with a bicuspid aortic valve.  I stressed the importance of continued good blood pressure control in  preventing further enlargement and acute aortic dissection.  I advised him against doing any heavy lifting that may require a Valsalva maneuver and could suddenly raise his blood pressure to high levels.   Plan:  He will continue to follow-up with Dr. Pietro and will need a repeat 2D echocardiogram in May 2026.  I will plan to see him back in 1 year with a repeat CTA of the chest for aortic surveillance.  I spent 15 minutes performing this established patient evaluation and > 50% of this time was spent face to face counseling and coordinating the care of this patient's bicuspid aortic valve stenosis and ascending aortic aneurysm.    Dorise MARLA Fellers, MD [facility] [phone]
